# Patient Record
Sex: Male | Born: 1970 | Race: Black or African American | Hispanic: No | Marital: Single | Smoking: Current every day smoker
Health system: Southern US, Community
[De-identification: ages and names within clinical notes are randomized; demographics above are authoritative.]

## PROBLEM LIST (undated history)

## (undated) DIAGNOSIS — R011 Cardiac murmur, unspecified: Secondary | ICD-10-CM

## (undated) HISTORY — PX: KNEE SURGERY: SHX244

---

## 2008-03-09 HISTORY — PX: KNEE SURGERY: SHX244

## 2021-03-22 ENCOUNTER — Encounter (HOSPITAL_BASED_OUTPATIENT_CLINIC_OR_DEPARTMENT_OTHER): Payer: Self-pay | Admitting: Emergency Medicine

## 2021-03-22 ENCOUNTER — Other Ambulatory Visit: Payer: Self-pay

## 2021-03-22 ENCOUNTER — Emergency Department (HOSPITAL_BASED_OUTPATIENT_CLINIC_OR_DEPARTMENT_OTHER): Payer: 59

## 2021-03-22 ENCOUNTER — Emergency Department (HOSPITAL_BASED_OUTPATIENT_CLINIC_OR_DEPARTMENT_OTHER)
Admission: EM | Admit: 2021-03-22 | Discharge: 2021-03-22 | Disposition: A | Discharge status: Home / Self Care | Payer: 59 | Attending: Emergency Medicine | Admitting: Emergency Medicine

## 2021-03-22 DIAGNOSIS — R0602 Shortness of breath: Secondary | ICD-10-CM | POA: Insufficient documentation

## 2021-03-22 DIAGNOSIS — R062 Wheezing: Secondary | ICD-10-CM | POA: Insufficient documentation

## 2021-03-22 DIAGNOSIS — R079 Chest pain, unspecified: Secondary | ICD-10-CM

## 2021-03-22 LAB — CBC WITH DIFFERENTIAL/PLATELET
Abs Immature Granulocytes: 0.04 10*3/uL (ref 0.00–0.07)
Basophils Absolute: 0 10*3/uL (ref 0.0–0.1)
Basophils Relative: 1 %
Eosinophils Absolute: 0.2 10*3/uL (ref 0.0–0.5)
Eosinophils Relative: 2 %
HCT: 40.6 % (ref 39.0–52.0)
Hemoglobin: 14.1 g/dL (ref 13.0–17.0)
Immature Granulocytes: 1 %
Lymphocytes Relative: 43 %
Lymphs Abs: 3 10*3/uL (ref 0.7–4.0)
MCH: 32.5 pg (ref 26.0–34.0)
MCHC: 34.7 g/dL (ref 30.0–36.0)
MCV: 93.5 fL (ref 80.0–100.0)
Monocytes Absolute: 0.7 10*3/uL (ref 0.1–1.0)
Monocytes Relative: 10 %
Neutro Abs: 3.1 10*3/uL (ref 1.7–7.7)
Neutrophils Relative %: 43 %
Platelets: 240 10*3/uL (ref 150–400)
RBC: 4.34 MIL/uL (ref 4.22–5.81)
RDW: 13.2 % (ref 11.5–15.5)
WBC: 7 10*3/uL (ref 4.0–10.5)
nRBC: 0 % (ref 0.0–0.2)

## 2021-03-22 LAB — BASIC METABOLIC PANEL
Anion gap: 8 (ref 5–15)
BUN: 16 mg/dL (ref 6–20)
CO2: 25 mmol/L (ref 22–32)
Calcium: 9.4 mg/dL (ref 8.9–10.3)
Chloride: 103 mmol/L (ref 98–111)
Creatinine, Ser: 1.05 mg/dL (ref 0.61–1.24)
GFR, Estimated: >60 mL/min (ref >60)
Glucose, Bld: 96 mg/dL (ref 70–99)
Potassium: 3.9 mmol/L (ref 3.5–5.1)
Sodium: 136 mmol/L (ref 135–145)

## 2021-03-22 LAB — TROPONIN I (HIGH SENSITIVITY): Troponin I (High Sensitivity): 3 ng/L (ref <18)

## 2021-03-22 MED ORDER — OMEPRAZOLE 20 MG PO CPDR: 20.0000 mg | DELAYED_RELEASE_CAPSULE | Freq: Every day | ORAL | 0 refills | Status: DC

## 2021-03-22 MED ORDER — ALUM & MAG HYDROXIDE-SIMETH 200-200-20 MG/5ML PO SUSP
30.0000 mL | Freq: Once | ORAL | Status: AC
Start: 2021-03-22 — End: 2021-03-22
  Administered 2021-03-22: 30 mL via ORAL
  Filled 2021-03-22: qty 30

## 2021-03-22 MED ORDER — ALBUTEROL SULFATE HFA 108 (90 BASE) MCG/ACT IN AERS
2.0000 | INHALATION_SPRAY | Freq: Once | RESPIRATORY_TRACT | Status: AC
Start: 2021-03-22 — End: 2021-03-22
  Administered 2021-03-22: 2 via RESPIRATORY_TRACT
  Filled 2021-03-22: qty 6.7

## 2021-03-22 MED ORDER — LIDOCAINE VISCOUS HCL 2 % MT SOLN
15.0000 mL | Freq: Once | OROMUCOSAL | Status: AC
  Administered 2021-03-22: 15 mL via ORAL
  Filled 2021-03-22: qty 15

## 2021-03-22 NOTE — ED Notes (Signed)
Patient transported to X-ray 

## 2021-03-22 NOTE — ED Notes (Signed)
Pt ambulatory with steady gait to restroom 

## 2021-03-22 NOTE — ED Notes (Signed)
Pt discharged to home. Discharge instructions have been discussed with patient and/or family members. Pt verbally acknowledges understanding d/c instructions, and endorses comprehension to checkout at registration before leaving.  °

## 2021-03-22 NOTE — Discharge Instructions (Addendum)
Take the medication as discussed.  Follow-up with a primary care doctor if your symptoms continue.  Return to the emergency room if you have any worsening symptoms.

## 2021-03-22 NOTE — ED Triage Notes (Signed)
Pt arrives pov with c/o CP with shob x 2 weeks, denies n/v/d or radiation of pain. Ambulatory

## 2021-03-22 NOTE — ED Provider Notes (Signed)
[facility] Provider Note   CSN: [id number] Arrival date & time: 03/22/21  [id number]     History  Chief Complaint  Patient presents with   Chest Pain    [facility] is a 51 y.o. male.  Patient is a 51 year old male who presents with chest pain.  He reports a 2-week history of chest pain.  Its fairly constant but waxes and wanes in intensity.  Its in the center of his chest ranging from his epigastrium up into his throat area.  It is otherwise nonradiating.  He has some associated shortness of breath although he says he gets short of breath more when the pain intensifies.  He does not have shortness of breath on exertion.  No exertional chest pain.  No pleuritic pain.  He has noticed a bit of a cough and wheezing recently over the last month.  He is a smoker.  No known history of asthma or other lung issues.  No fevers.  He had a cold about a week ago but no recent symptoms.  No nausea or vomiting.  No diaphoresis.  No leg pain or swelling.  No known history of heart problems.      Home Medications Prior to Admission medications   Medication Sig Start Date End Date Taking? Authorizing Provider  omeprazole (PRILOSEC) 20 MG capsule Take 1 capsule (20 mg total) by mouth daily. 03/22/21   Malvin Johns, MD      Allergies    Patient has no allergy information on record.    Review of Systems   Review of Systems  Constitutional:  Negative for chills, diaphoresis, fatigue and fever.  HENT:  Negative for congestion, rhinorrhea and sneezing.   Eyes: Negative.   Respiratory:  Positive for cough, shortness of breath and wheezing. Negative for chest tightness.   Cardiovascular:  Positive for chest pain. Negative for leg swelling.  Gastrointestinal:  Negative for abdominal pain, blood in stool, diarrhea, nausea and vomiting.  Genitourinary:  Negative for difficulty urinating, flank pain, frequency and hematuria.  Musculoskeletal:  Negative for arthralgias and  back pain.  Skin:  Negative for rash.  Neurological:  Negative for dizziness, speech difficulty, weakness, numbness and headaches.   Physical Exam Updated Vital Signs BP 118/79 (BP Location: Right Arm)    Pulse 87    Temp 98.5 F (36.9 C) (Oral)    Resp 15    Ht 6' (1.829 m)    Wt 83.9 kg    SpO2 99%    BMI 25.09 kg/m  Physical Exam Constitutional:      Appearance: He is well-developed.  HENT:     Head: Normocephalic and atraumatic.  Eyes:     Pupils: Pupils are equal, round, and reactive to light.  Cardiovascular:     Rate and Rhythm: Normal rate and regular rhythm.     Heart sounds: Normal heart sounds.  Pulmonary:     Effort: Pulmonary effort is normal. No respiratory distress.     Breath sounds: Wheezing (Few scattered wheezes on exam, no increased work of breathing, no tachypnea) present. No rales.  Chest:     Chest wall: No tenderness.  Abdominal:     General: Bowel sounds are normal.     Palpations: Abdomen is soft.     Tenderness: There is no abdominal tenderness. There is no guarding or rebound.  Musculoskeletal:        General: Normal range of motion.     Cervical back: Normal range of  motion and neck supple.     Comments: No edema or calf tenderness  Lymphadenopathy:     Cervical: No cervical adenopathy.  Skin:    General: Skin is warm and dry.     Findings: No rash.  Neurological:     Mental Status: He is alert and oriented to person, place, and time.    ED Results / Procedures / Treatments   Labs (all labs ordered are listed, but only abnormal results are displayed) Labs Reviewed  BASIC METABOLIC PANEL  CBC WITH DIFFERENTIAL/PLATELET  TROPONIN I (HIGH SENSITIVITY)  TROPONIN I (HIGH SENSITIVITY)    EKG EKG Interpretation  Date/Time:  Saturday March 22 2021 08:11:44 EST Ventricular Rate:  94 PR Interval:  177 QRS Duration: 96 QT Interval:  334 QTC Calculation: 418 R Axis:   65 Text Interpretation: Sinus rhythm ST elev, probable normal early  repol pattern No old tracing to compare Confirmed by Malvin Johns [phone]) on 03/22/2021 8:12:54 AM  Radiology DG Chest 2 View  Result Date: 03/22/2021 CLINICAL DATA:  Chest pain EXAM: CHEST - 2 VIEW COMPARISON:  None. FINDINGS: The heart size and mediastinal contours are within normal limits. Both lungs are clear. The visualized skeletal structures are unremarkable. IMPRESSION: No active cardiopulmonary disease. Electronically Signed   By: Elmer Picker M.D.   On: 03/22/2021 08:38    Procedures Procedures    Medications Ordered in ED Medications  alum & mag hydroxide-simeth (MAALOX/MYLANTA) 200-200-20 MG/5ML suspension 30 mL (30 mLs Oral Given 03/22/21 0859)    And  lidocaine (XYLOCAINE) 2 % viscous mouth solution 15 mL (15 mLs Oral Given 03/22/21 0859)  albuterol (VENTOLIN HFA) 108 (90 Base) MCG/ACT inhaler 2 puff (2 puffs Inhalation Given 03/22/21 0831)    ED Course/ Medical Decision Making/ A&P                           Medical Decision Making Amount and/or Complexity of Data Reviewed Labs: ordered. Decision-making details documented in ED Course. Radiology: ordered and independent interpretation performed. Decision-making details documented in ED Course. ECG/medicine tests: ordered and independent interpretation performed. Decision-making details documented in ED Course.  Risk Prescription drug management. Decision regarding hospitalization.   Patient is a 51 year old male who presents with chest pain.  Its in the center of his chest.  He does have a known history of GERD which he says is pretty bad.  He has no reproducible tenderness.  He has had a troponin that was negative.  No ischemic changes on EKG.  This was reviewed by me.  He had a chest x-ray which shows no acute abnormalities.  This was interpreted by me as well.  His other labs are nonconcerning.  He does not have symptoms that sound more concerning for aortic dissection, PE or ACS.  His symptoms improved after GI  cocktail in the ED.  I feel that it likely could be related to GERD.  A second troponin was not ordered given the constant nature of his symptoms.  I did not feel a second troponin would be beneficial at this point.  His symptoms have resolved after the GI cocktail.  He did have a little bit of wheezing on exam but no ongoing or current shortness of breath.  This is likely related to his smoking.  He was counseled on smoking cessation.  He was given an albuterol inhaler to use.  He was encouraged to follow-up with her primary care doctor.  Return  precautions were given.  He was given a prescription for omeprazole.    Admission for further cardiac evaluation was considered but he has a low heart score and no ongoing symptoms.  Outpatient follow-up appears appropriate.  Final Clinical Impression(s) / ED Diagnoses Final diagnoses:  Nonspecific chest pain    Rx / DC Orders ED Discharge Orders          Ordered    omeprazole (PRILOSEC) 20 MG capsule  Daily,   Status:  Discontinued        03/22/21 0933    omeprazole (PRILOSEC) 20 MG capsule  Daily        03/22/21 0934              Malvin Johns, MD 03/22/21 [phone]

## 2021-03-22 NOTE — ED Notes (Signed)
Pt endorses hx of reflux

## 2021-03-22 NOTE — ED Notes (Signed)
ED Provider at bedside. 

## 2022-01-17 ENCOUNTER — Other Ambulatory Visit: Payer: Self-pay

## 2022-01-17 ENCOUNTER — Emergency Department (HOSPITAL_BASED_OUTPATIENT_CLINIC_OR_DEPARTMENT_OTHER): Payer: 59 | Admitting: Radiology

## 2022-01-17 ENCOUNTER — Encounter (HOSPITAL_BASED_OUTPATIENT_CLINIC_OR_DEPARTMENT_OTHER): Payer: Self-pay | Admitting: Emergency Medicine

## 2022-01-17 ENCOUNTER — Emergency Department (HOSPITAL_BASED_OUTPATIENT_CLINIC_OR_DEPARTMENT_OTHER)
Admission: EM | Admit: 2022-01-17 | Discharge: 2022-01-17 | Disposition: A | Discharge status: Home / Self Care | Payer: 59 | Attending: Emergency Medicine | Admitting: Emergency Medicine

## 2022-01-17 DIAGNOSIS — Z87891 Personal history of nicotine dependence: Secondary | ICD-10-CM | POA: Insufficient documentation

## 2022-01-17 DIAGNOSIS — R079 Chest pain, unspecified: Secondary | ICD-10-CM | POA: Diagnosis present

## 2022-01-17 DIAGNOSIS — R072 Precordial pain: Secondary | ICD-10-CM | POA: Insufficient documentation

## 2022-01-17 LAB — CBC
HCT: 40.5 % (ref 39.0–52.0)
Hemoglobin: 13.5 g/dL (ref 13.0–17.0)
MCH: 32.1 pg (ref 26.0–34.0)
MCHC: 33.3 g/dL (ref 30.0–36.0)
MCV: 96.4 fL (ref 80.0–100.0)
Platelets: 208 10*3/uL (ref 150–400)
RBC: 4.2 MIL/uL — ABNORMAL LOW (ref 4.22–5.81)
RDW: 14.3 % (ref 11.5–15.5)
WBC: 8 10*3/uL (ref 4.0–10.5)
nRBC: 0 % (ref 0.0–0.2)

## 2022-01-17 LAB — BASIC METABOLIC PANEL
Anion gap: 10 (ref 5–15)
BUN: 8 mg/dL (ref 6–20)
CO2: 21 mmol/L — ABNORMAL LOW (ref 22–32)
Calcium: 8.6 mg/dL — ABNORMAL LOW (ref 8.9–10.3)
Chloride: 105 mmol/L (ref 98–111)
Creatinine, Ser: 1.03 mg/dL (ref 0.61–1.24)
GFR, Estimated: >60 mL/min (ref >60)
Glucose, Bld: 114 mg/dL — ABNORMAL HIGH (ref 70–99)
Potassium: 3.5 mmol/L (ref 3.5–5.1)
Sodium: 136 mmol/L (ref 135–145)

## 2022-01-17 LAB — TROPONIN I (HIGH SENSITIVITY)
Troponin I (High Sensitivity): 3 ng/L (ref <18)
Troponin I (High Sensitivity): 2 ng/L (ref <18)

## 2022-01-17 NOTE — ED Triage Notes (Signed)
Chest pain comes and goes since Tuesday Slightly more intense tonight. Some sob, headaches, periods of lightheaded

## 2022-01-17 NOTE — Discharge Instructions (Signed)
You were seen today for chest pain.  Your work-up today is reassuring.  You should consider quitting smoking as this puts you at increased risk for cardiac disease.  Follow-up with cardiology.

## 2022-01-17 NOTE — ED Notes (Signed)
Pt. Resting with eyes closed, resps even and unlabored

## 2022-01-17 NOTE — ED Provider Notes (Signed)
[facility] EMERGENCY DEPT Provider Note   CSN: [id number] Arrival date & time: 01/17/22  0215     History  Chief Complaint  Patient presents with   Chest Pain    Arney Mayabb is a 51 y.o. male.  HPI    \ This is a 51 year old male with a history of smoking who presents with chest pain.  Patient reports intermittent chest pain that comes and goes.  It has been ongoing since Tuesday.  He describes it as sharp and nonradiating.  It is not associated with eating.  He does report at times he feels like he needs to sit down to catch his breath.  No recent fevers or cough.  Denies any recent travel or history of blood clots.  Home Medications Prior to Admission medications   Medication Sig Start Date End Date Taking? Authorizing Provider  omeprazole (PRILOSEC) 20 MG capsule Take 1 capsule (20 mg total) by mouth daily. 03/22/21   Rolan Bucco, MD      Allergies    Patient has no known allergies.    Review of Systems   Review of Systems  Respiratory:  Positive for shortness of breath.   Cardiovascular:  Positive for chest pain.  All other systems reviewed and are negative.   Physical Exam Updated Vital Signs BP 136/75   Pulse 69   Temp 97.8 F (36.6 C) (Oral)   Resp 15   Ht 1.803 m (5\' 11" )   Wt 86.2 kg   SpO2 96%   BMI 26.50 kg/m  Physical Exam Vitals and nursing note reviewed.  Constitutional:      Appearance: He is well-developed.  HENT:     Head: Normocephalic and atraumatic.  Eyes:     Pupils: Pupils are equal, round, and reactive to light.  Cardiovascular:     Rate and Rhythm: Normal rate and regular rhythm.     Heart sounds: Normal heart sounds. No murmur heard. Pulmonary:     Effort: Pulmonary effort is normal. No respiratory distress.     Breath sounds: Normal breath sounds. No wheezing.  Abdominal:     General: Bowel sounds are normal.     Palpations: Abdomen is soft.     Tenderness: There is no abdominal tenderness. There is no  rebound.  Musculoskeletal:     Cervical back: Neck supple.  Lymphadenopathy:     Cervical: No cervical adenopathy.  Skin:    General: Skin is warm and dry.  Neurological:     General: No focal deficit present.     Mental Status: He is alert and oriented to person, place, and time.     ED Results / Procedures / Treatments   Labs (all labs ordered are listed, but only abnormal results are displayed) Labs Reviewed  CBC - Abnormal; Notable for the following components:      Result Value   RBC 4.20 (*)    All other components within normal limits  BASIC METABOLIC PANEL - Abnormal; Notable for the following components:   CO2 21 (*)    Glucose, Bld 114 (*)    Calcium 8.6 (*)    All other components within normal limits  TROPONIN I (HIGH SENSITIVITY)  TROPONIN I (HIGH SENSITIVITY)    EKG None  Radiology DG Chest 2 View  Result Date: 01/17/2022 CLINICAL DATA:  Chest pain. EXAM: CHEST - 2 VIEW COMPARISON:  03/22/2021. FINDINGS: The heart size and mediastinal contours are within normal limits. No consolidation, effusion, or pneumothorax. No acute  osseous abnormality. IMPRESSION: No active cardiopulmonary disease. Electronically Signed   By: Thornell Sartorius M.D.   On: 01/17/2022 02:52    Procedures Procedures    Medications Ordered in ED Medications - No data to display  ED Course/ Medical Decision Making/ A&P                           Medical Decision Making Amount and/or Complexity of Data Reviewed Labs: ordered. Radiology: ordered.   This patient presents to the ED for concern of chest pain, this involves an extensive number of treatment options, and is a complaint that carries with it a high risk of complications and morbidity.  I considered the following differential and admission for this acute, potentially life threatening condition.  The differential diagnosis includes ACS, pneumonia, pneumothorax, PE, costochondritis  MDM:    This is a 51 year old male who  presents with chest discomfort.  He is nontoxic and vital signs are reassuring.  His only risk factor for ACS is smoking.  EKG is unchanged from prior without evidence of acute arrhythmia or ischemia.  Troponin x2 is negative.  CBC and BMP are largely reassuring.  Chest x-ray without pneumothorax or pneumonia.  He is PERC negative.  Given his smoking history and age as well as some exertional symptoms, his heart score puts him at 2.  We will have him follow-up with cardiology  (Labs, imaging, consults)  Labs: I Ordered, and personally interpreted labs.  The pertinent results include: BBC, BMP, troponin x2  Imaging Studies ordered: I ordered imaging studies including x-ray I independently visualized and interpreted imaging. I agree with the radiologist interpretation  Additional history obtained from chart review.  External records from outside source obtained and reviewed including prior evaluations  Cardiac Monitoring: The patient was maintained on a cardiac monitor.  I personally viewed and interpreted the cardiac monitored which showed an underlying rhythm of: Sinus rhythm  Reevaluation: After the interventions noted above, I reevaluated the patient and found that they have :improved  Social Determinants of Health:  smoker  Disposition: Discharge  Co morbidities that complicate the patient evaluation History reviewed. No pertinent past medical history.   Medicines No orders of the defined types were placed in this encounter.   I have reviewed the patients home medicines and have made adjustments as needed  Problem List / ED Course: Problem List Items Addressed This Visit   None Visit Diagnoses     Precordial pain    -  Primary   Relevant Orders   Ambulatory referral to Cardiology                   Final Clinical Impression(s) / ED Diagnoses Final diagnoses:  Precordial pain    Rx / DC Orders ED Discharge Orders          Ordered    Ambulatory  referral to Cardiology        01/17/22 0629              Shon Baton, MD 01/17/22 [phone]

## 2022-01-30 ENCOUNTER — Emergency Department (HOSPITAL_BASED_OUTPATIENT_CLINIC_OR_DEPARTMENT_OTHER)
Admission: EM | Admit: 2022-01-30 | Discharge: 2022-01-30 | Disposition: A | Discharge status: Home / Self Care | Payer: 59 | Attending: Emergency Medicine | Admitting: Emergency Medicine

## 2022-01-30 ENCOUNTER — Encounter (HOSPITAL_BASED_OUTPATIENT_CLINIC_OR_DEPARTMENT_OTHER): Payer: Self-pay

## 2022-01-30 ENCOUNTER — Other Ambulatory Visit: Payer: Self-pay

## 2022-01-30 DIAGNOSIS — M545 Low back pain, unspecified: Secondary | ICD-10-CM | POA: Diagnosis present

## 2022-01-30 MED ORDER — NAPROXEN 500 MG PO TABS: 500.0000 mg | ORAL_TABLET | Freq: Two times a day (BID) | ORAL | 0 refills | Status: DC

## 2022-01-30 MED ORDER — CYCLOBENZAPRINE HCL 10 MG PO TABS: 10.0000 mg | ORAL_TABLET | Freq: Three times a day (TID) | ORAL | 0 refills | Status: DC | PRN

## 2022-01-30 MED ORDER — KETOROLAC TROMETHAMINE 60 MG/2ML IM SOLN
60.0000 mg | Freq: Once | INTRAMUSCULAR | Status: AC
  Administered 2022-01-30: 60 mg via INTRAMUSCULAR
  Filled 2022-01-30: qty 2

## 2022-01-30 NOTE — ED Notes (Signed)
ED Provider at bedside. 

## 2022-01-30 NOTE — Discharge Instructions (Signed)
Begin taking naproxen as prescribed.  Begin taking Flexeril as prescribed as needed for pain not relieved with naproxen.  Rest.  Follow-up with primary doctor if not improving in the next week.

## 2022-01-30 NOTE — ED Provider Notes (Signed)
  [facility] EMERGENCY DEPT Provider Note   CSN: [id number] Arrival date & time: 01/30/22  0016     History  Chief Complaint  Patient presents with   Back Pain    [facility] is a 51 y.o. male.  Patient is a 52 year old male with no significant past medical history.  He presents with a 2-week history of low back pain.  This began in the absence of any injury or trauma.  He describes a constant ache to the center of his low back with no radiation into his legs.  He denies any bowel or bladder complaints.  He denies any weakness or numbness of the legs.  Pain is worse with bending and movement with no alleviating factors.  The history is provided by the patient.       Home Medications Prior to Admission medications   Medication Sig Start Date End Date Taking? Authorizing Provider  omeprazole (PRILOSEC) 20 MG capsule Take 1 capsule (20 mg total) by mouth daily. 03/22/21   Rolan Bucco, MD      Allergies    Patient has no known allergies.    Review of Systems   Review of Systems  All other systems reviewed and are negative.   Physical Exam Updated Vital Signs BP 121/84 (BP Location: Left Arm)   Pulse 84   Temp 98.7 F (37.1 C) (Oral)   Resp 18   Ht 6' (1.829 m)   Wt 86.6 kg   SpO2 99%   BMI 25.90 kg/m  Physical Exam Vitals and nursing note reviewed.  Constitutional:      General: He is not in acute distress.    Appearance: Normal appearance. He is not ill-appearing.  HENT:     Head: Normocephalic and atraumatic.  Pulmonary:     Effort: Pulmonary effort is normal.  Musculoskeletal:     Comments: There is tenderness to palpation in the soft tissues of the lumbar region.  Skin:    General: Skin is warm and dry.  Neurological:     General: No focal deficit present.     Mental Status: He is alert and oriented to person, place, and time.     Comments: DTRs are 1+ and symmetrical in the patellar and Achilles tendons bilaterally.  Strength is 5  out of 5 in both lower extremities.  He is able to ambulate on heels and toes.     ED Results / Procedures / Treatments   Labs (all labs ordered are listed, but only abnormal results are displayed) Labs Reviewed - No data to display  EKG None  Radiology No results found.  Procedures Procedures    Medications Ordered in ED Medications  ketorolac (TORADOL) injection 60 mg (has no administration in time range)    ED Course/ Medical Decision Making/ A&P  Patient presenting here with low back pain as described in the HPI.  I highly suspect a muscular etiology.  He has no radicular symptoms, normal and symmetrical reflexes, and no red flags that would suggest an emergent situation.  He will be treated with IM Toradol and discharged with naproxen and NSAIDs.  To return as needed for any problems, follow-up if not improving.  Final Clinical Impression(s) / ED Diagnoses Final diagnoses:  None    Rx / DC Orders ED Discharge Orders     None         Geoffery Lyons, MD 01/30/22 [phone]

## 2022-01-30 NOTE — ED Triage Notes (Signed)
Pt presents with c/o lower back pain for two weeks. Unknown injury to back. Denies difficulty voiding or passing stool.   Denies radiation down either leg.

## 2022-04-02 ENCOUNTER — Other Ambulatory Visit: Payer: Self-pay

## 2022-04-02 ENCOUNTER — Encounter (HOSPITAL_BASED_OUTPATIENT_CLINIC_OR_DEPARTMENT_OTHER): Payer: Self-pay | Admitting: *Deleted

## 2022-04-02 ENCOUNTER — Emergency Department (HOSPITAL_BASED_OUTPATIENT_CLINIC_OR_DEPARTMENT_OTHER)
Admission: EM | Admit: 2022-04-02 | Discharge: 2022-04-02 | Disposition: A | Discharge status: Home / Self Care | Payer: 59 | Attending: Emergency Medicine | Admitting: Emergency Medicine

## 2022-04-02 DIAGNOSIS — J029 Acute pharyngitis, unspecified: Secondary | ICD-10-CM | POA: Insufficient documentation

## 2022-04-02 DIAGNOSIS — R0981 Nasal congestion: Secondary | ICD-10-CM | POA: Insufficient documentation

## 2022-04-02 DIAGNOSIS — R519 Headache, unspecified: Secondary | ICD-10-CM | POA: Insufficient documentation

## 2022-04-02 DIAGNOSIS — J069 Acute upper respiratory infection, unspecified: Secondary | ICD-10-CM

## 2022-04-02 NOTE — ED Notes (Signed)
Discharge paperwork given and verbally understood. 

## 2022-04-02 NOTE — ED Provider Notes (Signed)
[facility] Provider Note   CSN: [id number] Arrival date & time: 04/02/22  0630     History  Chief Complaint  Patient presents with   Headache    [facility] is a 52 y.o. male.  [facility] is a 52 y.o. male who is otherwise healthy, presents to the ED requesting a note to return to work.  He reports that he missed work on Monday due to headache, nasal congestion, runny nose and chills.  He reports that overall symptoms are improving he still seems to have a very mild frontal headache but no continued fevers, no cough.  He reports multiple coworkers have been out recently with flu and other illnesses.  He has not taken any thing for his symptoms at this time.  He denies any nausea, vomiting, diarrhea, chest pain, shortness of breath or abdominal pain.  Reports he cannot return to work without a note.  The history is provided by the patient.  Headache Associated symptoms: congestion and sore throat   Associated symptoms: no abdominal pain, no cough, no diarrhea, no fever, no nausea, no neck pain, no neck stiffness and no vomiting        Home Medications Prior to Admission medications   Medication Sig Start Date End Date Taking? Authorizing Provider  cyclobenzaprine (FLEXERIL) 10 MG tablet Take 1 tablet (10 mg total) by mouth 3 (three) times daily as needed for muscle spasms. 01/30/22   Veryl Speak, MD  naproxen (NAPROSYN) 500 MG tablet Take 1 tablet (500 mg total) by mouth 2 (two) times daily. 01/30/22   Veryl Speak, MD  omeprazole (PRILOSEC) 20 MG capsule Take 1 capsule (20 mg total) by mouth daily. 03/22/21   Malvin Johns, MD      Allergies    Patient has no known allergies.    Review of Systems   Review of Systems  Constitutional:  Positive for chills. Negative for fever.  HENT:  Positive for congestion, rhinorrhea and sore throat.   Respiratory:  Negative for cough and shortness of breath.   Cardiovascular:  Negative  for chest pain.  Gastrointestinal:  Negative for abdominal pain, diarrhea, nausea and vomiting.  Musculoskeletal:  Negative for neck pain and neck stiffness.  Neurological:  Positive for headaches.    Physical Exam Updated Vital Signs BP (!) 132/90 (BP Location: Left Arm)   Pulse 93   Temp 98.2 F (36.8 C) (Oral)   Resp 18   Ht 6' (1.829 m)   Wt 83.9 kg   SpO2 98%   BMI 25.09 kg/m  Physical Exam Vitals and nursing note reviewed.  Constitutional:      General: He is not in acute distress.    Appearance: He is well-developed. He is not ill-appearing or diaphoretic.  HENT:     Head: Normocephalic and atraumatic.     Nose: Congestion and rhinorrhea present.     Comments: Mild tenderness noted over the left frontal sinus with some nasal congestion noted worse on the right    Mouth/Throat:     Mouth: Mucous membranes are moist.     Pharynx: Oropharynx is clear. No oropharyngeal exudate or posterior oropharyngeal erythema.  Eyes:     General:        Right eye: No discharge.        Left eye: No discharge.  Neck:     Comments: No rigidity, full active ROM, no meningeal signs Cardiovascular:     Rate and Rhythm: Normal rate  and regular rhythm.     Heart sounds: Normal heart sounds. No murmur heard.    No friction rub. No gallop.  Pulmonary:     Effort: Pulmonary effort is normal. No respiratory distress.     Breath sounds: Normal breath sounds.     Comments: Respirations equal and unlabored, patient able to speak in full sentences, lungs clear to auscultation bilaterally  Abdominal:     General: Bowel sounds are normal. There is no distension.     Palpations: Abdomen is soft. There is no mass.     Tenderness: There is no abdominal tenderness. There is no guarding.     Comments: Abdomen soft, nondistended, nontender to palpation in all quadrants without guarding or peritoneal signs  Musculoskeletal:        General: No deformity.     Cervical back: Neck supple.   Lymphadenopathy:     Cervical: No cervical adenopathy.  Skin:    General: Skin is warm and dry.     Capillary Refill: Capillary refill takes less than 2 seconds.  Neurological:     General: No focal deficit present.     Mental Status: He is alert and oriented to person, place, and time.     GCS: GCS eye subscore is 4. GCS verbal subscore is 5. GCS motor subscore is 6.     Comments: Speech is clear, able to follow commands Moves extremities without ataxia, coordination intact  Psychiatric:        Mood and Affect: Mood normal.        Behavior: Behavior normal.     ED Results / Procedures / Treatments   Labs (all labs ordered are listed, but only abnormal results are displayed) Labs Reviewed - No data to display  EKG None  Radiology No results found.  Procedures Procedures    Medications Ordered in ED Medications - No data to display  ED Course/ Medical Decision Making/ A&P                             Medical Decision Making  52 yo male presents requesting note to return to work after URI symptoms starting on Monday that seem to be improving.  Reports mild headache at this time, has not used any medications to support symptoms.  Patient does still have some mild nasal congestion and tenderness over the sinus but given improvement in symptoms do not feel that further testing is indicated at this time.  Suspect viral upper respiratory illness that is resolving.  Discussed continued supportive care with over-the-counter medications.  No provided to return to work.        Final Clinical Impression(s) / ED Diagnoses Final diagnoses:  None    Rx / DC Orders ED Discharge Orders     None         Janet Berlin 04/02/22 [place], Ankit, MD 04/03/22 1607

## 2022-04-02 NOTE — ED Triage Notes (Addendum)
Here from home for "work note" to return to work. Reports missed work on Monday d/t HA, nasal congestion, runny nose and chills. Sx resolving. Endorses "only mild HA at this time". Alert, NAD, calm, interactive, steady gait. Denies NVD, fever, syncope, or sob. States again, "just need a return to work note".

## 2022-04-02 NOTE — Discharge Instructions (Signed)
Your symptoms are likely caused by a viral upper respiratory infection. Antibiotics are not helpful in treating viral infection, the virus should run its course in about 5-7 days. Please make sure you are drinking plenty of fluids. You can treat your symptoms supportively with tylenol/ibuprofen for fevers and pains, Zyrtec and Flonase to help with nasal congestion, and over the counter cough syrups and throat lozenges to help with cough. If your symptoms are not improving please follow up with you Primary doctor.   If you develop persistent fevers, shortness of breath or difficulty breathing, chest pain, severe headache and neck pain, persistent nausea and vomiting or other new or concerning symptoms return to the Emergency department.  

## 2022-05-21 ENCOUNTER — Ambulatory Visit (HOSPITAL_BASED_OUTPATIENT_CLINIC_OR_DEPARTMENT_OTHER): Payer: 59 | Admitting: Family Medicine

## 2022-06-01 ENCOUNTER — Encounter (HOSPITAL_BASED_OUTPATIENT_CLINIC_OR_DEPARTMENT_OTHER): Payer: Self-pay | Admitting: Emergency Medicine

## 2022-06-01 ENCOUNTER — Emergency Department (HOSPITAL_BASED_OUTPATIENT_CLINIC_OR_DEPARTMENT_OTHER): Payer: 59

## 2022-06-01 ENCOUNTER — Emergency Department (HOSPITAL_BASED_OUTPATIENT_CLINIC_OR_DEPARTMENT_OTHER)
Admission: EM | Admit: 2022-06-01 | Discharge: 2022-06-01 | Disposition: A | Discharge status: Home / Self Care | Payer: 59 | Attending: Emergency Medicine | Admitting: Emergency Medicine

## 2022-06-01 ENCOUNTER — Other Ambulatory Visit: Payer: Self-pay

## 2022-06-01 DIAGNOSIS — R519 Headache, unspecified: Secondary | ICD-10-CM | POA: Insufficient documentation

## 2022-06-01 DIAGNOSIS — R110 Nausea: Secondary | ICD-10-CM | POA: Diagnosis not present

## 2022-06-01 LAB — CBC WITH DIFFERENTIAL/PLATELET
Abs Immature Granulocytes: 0.01 10*3/uL (ref 0.00–0.07)
Basophils Absolute: 0 10*3/uL (ref 0.0–0.1)
Basophils Relative: 0 %
Eosinophils Absolute: 0.1 10*3/uL (ref 0.0–0.5)
Eosinophils Relative: 1 %
HCT: 41.7 % (ref 39.0–52.0)
Hemoglobin: 14.4 g/dL (ref 13.0–17.0)
Immature Granulocytes: 0 %
Lymphocytes Relative: 17 %
Lymphs Abs: 1.3 10*3/uL (ref 0.7–4.0)
MCH: 32.2 pg (ref 26.0–34.0)
MCHC: 34.5 g/dL (ref 30.0–36.0)
MCV: 93.3 fL (ref 80.0–100.0)
Monocytes Absolute: 0.6 10*3/uL (ref 0.1–1.0)
Monocytes Relative: 8 %
Neutro Abs: 5.9 10*3/uL (ref 1.7–7.7)
Neutrophils Relative %: 74 %
Platelets: 222 10*3/uL (ref 150–400)
RBC: 4.47 MIL/uL (ref 4.22–5.81)
RDW: 13.8 % (ref 11.5–15.5)
WBC: 7.9 10*3/uL (ref 4.0–10.5)
nRBC: 0 % (ref 0.0–0.2)

## 2022-06-01 LAB — BASIC METABOLIC PANEL
Anion gap: 6 (ref 5–15)
BUN: 13 mg/dL (ref 6–20)
CO2: 23 mmol/L (ref 22–32)
Calcium: 9.5 mg/dL (ref 8.9–10.3)
Chloride: 110 mmol/L (ref 98–111)
Creatinine, Ser: 0.91 mg/dL (ref 0.61–1.24)
GFR, Estimated: >60 mL/min (ref >60)
Glucose, Bld: 91 mg/dL (ref 70–99)
Potassium: 3.6 mmol/L (ref 3.5–5.1)
Sodium: 139 mmol/L (ref 135–145)

## 2022-06-01 MED ORDER — ONDANSETRON 4 MG PO TBDP: 4.0000 mg | ORAL_TABLET | Freq: Three times a day (TID) | ORAL | 0 refills | Status: DC | PRN

## 2022-06-01 MED ORDER — SODIUM CHLORIDE 0.9 % IV SOLN
Freq: Once | INTRAVENOUS | Status: AC
Start: 2022-06-01 — End: 2022-06-01

## 2022-06-01 MED ORDER — IOHEXOL 300 MG/ML  SOLN
100.0000 mL | Freq: Once | INTRAMUSCULAR | Status: AC | PRN
  Administered 2022-06-01: 75 mL via INTRAVENOUS

## 2022-06-01 MED ORDER — KETOROLAC TROMETHAMINE 30 MG/ML IJ SOLN
30.0000 mg | Freq: Once | INTRAMUSCULAR | Status: AC
  Administered 2022-06-01: 30 mg via INTRAVENOUS
  Filled 2022-06-01: qty 1

## 2022-06-01 MED ORDER — METOCLOPRAMIDE HCL 5 MG/ML IJ SOLN
10.0000 mg | Freq: Once | INTRAMUSCULAR | Status: AC
  Administered 2022-06-01: 10 mg via INTRAVENOUS
  Filled 2022-06-01: qty 2

## 2022-06-01 MED ORDER — ACETAMINOPHEN 500 MG PO TABS
1000.0000 mg | ORAL_TABLET | Freq: Once | ORAL | Status: AC
  Administered 2022-06-01: 1000 mg via ORAL
  Filled 2022-06-01: qty 2

## 2022-06-01 MED ORDER — DIPHENHYDRAMINE HCL 50 MG/ML IJ SOLN
25.0000 mg | Freq: Once | INTRAMUSCULAR | Status: AC
  Administered 2022-06-01: 25 mg via INTRAVENOUS
  Filled 2022-06-01: qty 1

## 2022-06-01 MED ORDER — IBUPROFEN 800 MG PO TABS: 800.0000 mg | ORAL_TABLET | Freq: Three times a day (TID) | ORAL | 0 refills | Status: DC

## 2022-06-01 NOTE — ED Provider Notes (Signed)
[facility] Provider Note   CSN: [id number] Arrival date & time: 06/01/22  1750     History  Chief Complaint  Patient presents with   Headache   Chest Pain    Luke Black is a 52 y.o. male.  HPI Patient reports he awakened at 5 or 6 this morning with a severe headache.  He reports the headache is generalized.  He did not noted to be 1 side or the other.  He denies any vomiting.  He reports that 1 point he did feel kind of nauseated but never went on to vomit.  He reports he been very light sensitive with his eyes.  No fevers.  He reports he was feeling well before symptoms started.  He denies any passing out episode.  Patient denies history of headaches.  He reports he is otherwise healthy.  He denies drugs of abuse.  Denies any recent dental pain sore throat or sinus congestion.  Patient reports a few days ago he had some pains that were on the back of his chest or behind the shoulder blades.  At one point it was somewhat on the left and then that moved to the right.  He reports those pains are gone and he did not have any chest pain or thoracic pain in association with his headache today.  He reports he ate some food last night and went to bed but this morning had a combination of feeling nauseated but did not vomit and a bad headache.  Denies any injury or other unusual activities leading up to this headache.    Home Medications Prior to Admission medications   Medication Sig Start Date End Date Taking? Authorizing Provider  ibuprofen (ADVIL) 800 MG tablet Take 1 tablet (800 mg total) by mouth 3 (three) times daily. 06/01/22  Yes Charlesetta Shanks, MD  ondansetron (ZOFRAN-ODT) 4 MG disintegrating tablet Take 1 tablet (4 mg total) by mouth every 8 (eight) hours as needed for nausea or vomiting. 06/01/22  Yes Charlesetta Shanks, MD  cyclobenzaprine (FLEXERIL) 10 MG tablet Take 1 tablet (10 mg total) by mouth 3 (three) times daily as needed for  muscle spasms. 01/30/22   Veryl Speak, MD  naproxen (NAPROSYN) 500 MG tablet Take 1 tablet (500 mg total) by mouth 2 (two) times daily. 01/30/22   Veryl Speak, MD  omeprazole (PRILOSEC) 20 MG capsule Take 1 capsule (20 mg total) by mouth daily. 03/22/21   Malvin Johns, MD      Allergies    Patient has no known allergies.    Review of Systems   Review of Systems  Physical Exam Updated Vital Signs BP 126/77   Pulse 82   Temp 98.1 F (36.7 C) (Oral)   Resp (!) 21   Ht 6' (1.829 m)   Wt 83 kg   SpO2 96%   BMI 24.82 kg/m  Physical Exam Constitutional:      Comments: Comfortable no confusion or somnolence.  HENT:     Head: Normocephalic and atraumatic.     Right Ear: Tympanic membrane normal.     Left Ear: Tympanic membrane normal.     Mouth/Throat:     Mouth: Mucous membranes are moist.     Pharynx: Oropharynx is clear.     Comments: Dentition good condition. Eyes:     Extraocular Movements: Extraocular movements intact.     Pupils: Pupils are equal, round, and reactive to light.  Cardiovascular:     Rate and  Rhythm: Normal rate and regular rhythm.  Pulmonary:     Effort: Pulmonary effort is normal.     Breath sounds: Normal breath sounds.  Abdominal:     General: There is no distension.     Palpations: Abdomen is soft.     Tenderness: There is no abdominal tenderness. There is no guarding.  Musculoskeletal:        General: Normal range of motion.     Cervical back: Neck supple.     Right lower leg: No edema.     Left lower leg: No edema.  Skin:    General: Skin is warm and dry.  Neurological:     General: No focal deficit present.     Mental Status: He is oriented to person, place, and time.     Cranial Nerves: No cranial nerve deficit.     Motor: No weakness.     Coordination: Coordination normal.     ED Results / Procedures / Treatments   Labs (all labs ordered are listed, but only abnormal results are displayed) Labs Reviewed  BASIC METABOLIC  PANEL  CBC WITH DIFFERENTIAL/PLATELET    EKG None  Radiology CT ANGIO HEAD NECK W WO CM  Result Date: 06/01/2022 CLINICAL DATA:  Sudden severe headache EXAM: CT ANGIOGRAPHY HEAD AND NECK TECHNIQUE: Multidetector CT imaging of the head and neck was performed using the standard protocol during bolus administration of intravenous contrast. Multiplanar CT image reconstructions and MIPs were obtained to evaluate the vascular anatomy. Carotid stenosis measurements (when applicable) are obtained utilizing NASCET criteria, using the distal internal carotid diameter as the denominator. RADIATION DOSE REDUCTION: This exam was performed according to the departmental dose-optimization program which includes automated exposure control, adjustment of the mA and/or kV according to patient size and/or use of iterative reconstruction technique. CONTRAST:  41mL OMNIPAQUE IOHEXOL 300 MG/ML  SOLN COMPARISON:  06/01/2022 head CT FINDINGS: CTA NECK FINDINGS SKELETON: There is no bony spinal canal stenosis. No lytic or blastic lesion. OTHER NECK: Normal pharynx, larynx and major salivary glands. No cervical lymphadenopathy. Unremarkable thyroid gland. UPPER CHEST: No pneumothorax or pleural effusion. No nodules or masses. AORTIC ARCH: There is no calcific atherosclerosis of the aortic arch. There is no aneurysm, dissection or hemodynamically significant stenosis of the visualized portion of the aorta. Conventional 3 vessel aortic branching pattern. The visualized proximal subclavian arteries are widely patent. RIGHT CAROTID SYSTEM: Normal without aneurysm, dissection or stenosis. LEFT CAROTID SYSTEM: Normal without aneurysm, dissection or stenosis. VERTEBRAL ARTERIES: Left dominant configuration. Both origins are clearly patent. There is no dissection, occlusion or flow-limiting stenosis to the skull base (V1-V3 segments). CTA HEAD FINDINGS POSTERIOR CIRCULATION: --Vertebral arteries: Normal V4 segments. --Inferior cerebellar  arteries: Normal. --Basilar artery: Normal. --Superior cerebellar arteries: Normal. --Posterior cerebral arteries (PCA): Normal. ANTERIOR CIRCULATION: --Intracranial internal carotid arteries: Normal. --Anterior cerebral arteries (ACA): Normal. Both A1 segments are present. Patent anterior communicating artery (a-comm). --Middle cerebral arteries (MCA): Normal. VENOUS SINUSES: As permitted by contrast timing, patent. ANATOMIC VARIANTS: None Review of the MIP images confirms the above findings. IMPRESSION: Normal CTA of the head and neck. Electronically Signed   By: Ulyses Jarred M.D.   On: 06/01/2022 20:38   CT Head Wo Contrast  Result Date: 06/01/2022 CLINICAL DATA:  Headache, sudden, severe. EXAM: CT HEAD WITHOUT CONTRAST TECHNIQUE: Contiguous axial images were obtained from the base of the skull through the vertex without intravenous contrast. RADIATION DOSE REDUCTION: This exam was performed according to the departmental dose-optimization program which  includes automated exposure control, adjustment of the mA and/or kV according to patient size and/or use of iterative reconstruction technique. COMPARISON:  None Available. FINDINGS: Brain: No acute hemorrhage, mass effect or midline shift. Gray-white differentiation is preserved. No hydrocephalus. No extra-axial collection. Basilar cisterns are patent. Vascular: No hyperdense vessel or unexpected calcification. Skull: No calvarial fracture or suspicious bone lesion. Skull base is unremarkable. Sinuses/Orbits: Mild mucosal disease in the ethmoid sinuses. Orbits are unremarkable. Mastoids are well aerated. Other: None. IMPRESSION: No acute intracranial abnormality. Electronically Signed   By: Emmit Alexanders M.D.   On: 06/01/2022 19:24    Procedures Procedures    Medications Ordered in ED Medications  ketorolac (TORADOL) 30 MG/ML injection 30 mg (has no administration in time range)  acetaminophen (TYLENOL) tablet 1,000 mg (has no administration in  time range)  metoCLOPramide (REGLAN) injection 10 mg (10 mg Intravenous Given 06/01/22 1852)  diphenhydrAMINE (BENADRYL) injection 25 mg (25 mg Intravenous Given 06/01/22 1852)  0.9 %  sodium chloride infusion ( Intravenous New Bag/Given 06/01/22 1850)  iohexol (OMNIPAQUE) 300 MG/ML solution 100 mL (75 mLs Intravenous Contrast Given 06/01/22 2006)    ED Course/ Medical Decision Making/ A&P                             Medical Decision Making Amount and/or Complexity of Data Reviewed Labs: ordered. Radiology: ordered.  Risk OTC drugs. Prescription drug management.   Is a severe headache.  He reports that as of acute onset this morning while he was sleeping.  He has prior headache history.  He has photophobia.  No confusion.  No fever.  Concern for subarachnoid hemorrhage.  Will proceed with CT scan.  Initiate pain control combination of Reglan and Benadryl and fluids.  Patient got some improvement with Benadryl Reglan and fluids.  CT angiogram head and neck negative for aneurysm or dissection.  No other acute findings no acute bleed present.  Patient did not have any history of fever or neck stiffness.  He does not have meningismus.  He is not confused or somnolent.  This time I have low suspicion for meningitis or encephalopathy as cause for headache.  Headache was of acute onset but at this point with no aneurysm or dissection or bleed, low suspicion for intracerebral hemorrhage or subarachnoid hemorrhage.  Patient is normotensive and does not have hypertension.  Patient improved with treatment.  At this point he does not have significant photophobia he does not have meningismus and feels more comfortable.  At this point I feel he is safe for discharge.  Patient is working on PCP follow-up.  He is advised to follow-up this week if possible, careful return precautions reviewed.  We reviewed a plan of using ibuprofen and Tylenol at home for pain control and zofran for nausea if  needed.        Final Clinical Impression(s) / ED Diagnoses Final diagnoses:  Bad headache    Rx / DC Orders ED Discharge Orders          Ordered    ondansetron (ZOFRAN-ODT) 4 MG disintegrating tablet  Every 8 hours PRN        06/01/22 2134    ibuprofen (ADVIL) 800 MG tablet  3 times daily        06/01/22 2134              Charlesetta Shanks, MD 06/01/22 2141

## 2022-06-01 NOTE — ED Notes (Signed)
Patient at CT

## 2022-06-01 NOTE — ED Triage Notes (Signed)
Pt reports waking up with headache. Also endorses CP for a few days. Denies SOB. Pt has light sensitivity.

## 2022-06-01 NOTE — ED Notes (Signed)
Reviewed AVS with patient, patient expressed understanding of directions, denies further questions at this time. 

## 2022-06-01 NOTE — Discharge Instructions (Signed)
1.  You had a very bad headache in the emergency department.  Your CT scan did not show evidence of an aneurysm or brain bleed.  Return immediately for recheck if you get new or concerning symptoms. 2.  You may take ibuprofen every 8 hours as prescribed for headache.  You may also take extra strength Tylenol per package instructions every 6 hours to help with headache.  His medications may be taken together, they work differently.  You may take Zofran if needed for nausea.  Rest and hydrate for the next 24 to 48 hours. 3.  You need to follow-up with your family doctor.  Make an appointment as soon as possible.  Return to the emergency department if you have concerning symptoms and need a recheck.

## 2022-08-19 ENCOUNTER — Emergency Department (HOSPITAL_BASED_OUTPATIENT_CLINIC_OR_DEPARTMENT_OTHER)
Admission: EM | Admit: 2022-08-19 | Discharge: 2022-08-19 | Disposition: A | Discharge status: Home / Self Care | Payer: 59 | Attending: Emergency Medicine | Admitting: Emergency Medicine

## 2022-08-19 ENCOUNTER — Other Ambulatory Visit: Payer: Self-pay

## 2022-08-19 ENCOUNTER — Encounter (HOSPITAL_BASED_OUTPATIENT_CLINIC_OR_DEPARTMENT_OTHER): Payer: Self-pay | Admitting: Emergency Medicine

## 2022-08-19 DIAGNOSIS — R1033 Periumbilical pain: Secondary | ICD-10-CM | POA: Diagnosis present

## 2022-08-19 LAB — COMPREHENSIVE METABOLIC PANEL
ALT: 7 U/L (ref 0–44)
AST: 9 U/L — ABNORMAL LOW (ref 15–41)
Albumin: 4.3 g/dL (ref 3.5–5.0)
Alkaline Phosphatase: 64 U/L (ref 38–126)
Anion gap: 7 (ref 5–15)
BUN: 12 mg/dL (ref 6–20)
CO2: 26 mmol/L (ref 22–32)
Calcium: 9.3 mg/dL (ref 8.9–10.3)
Chloride: 107 mmol/L (ref 98–111)
Creatinine, Ser: 1.09 mg/dL (ref 0.61–1.24)
GFR, Estimated: >60 mL/min (ref >60)
Glucose, Bld: 96 mg/dL (ref 70–99)
Potassium: 3.6 mmol/L (ref 3.5–5.1)
Sodium: 140 mmol/L (ref 135–145)
Total Bilirubin: 0.3 mg/dL (ref 0.3–1.2)
Total Protein: 6.7 g/dL (ref 6.5–8.1)

## 2022-08-19 LAB — CBC
HCT: 38 % — ABNORMAL LOW (ref 39.0–52.0)
Hemoglobin: 13.1 g/dL (ref 13.0–17.0)
MCH: 32 pg (ref 26.0–34.0)
MCHC: 34.5 g/dL (ref 30.0–36.0)
MCV: 92.7 fL (ref 80.0–100.0)
Platelets: 231 10*3/uL (ref 150–400)
RBC: 4.1 MIL/uL — ABNORMAL LOW (ref 4.22–5.81)
RDW: 14.4 % (ref 11.5–15.5)
WBC: 5 10*3/uL (ref 4.0–10.5)
nRBC: 0 % (ref 0.0–0.2)

## 2022-08-19 LAB — LIPASE, BLOOD: Lipase: 15 U/L (ref 11–51)

## 2022-08-19 NOTE — ED Triage Notes (Signed)
Pt  here from home with c/o abd pain times 1 month .pain usually around his naval , no n/v

## 2022-08-19 NOTE — ED Provider Notes (Signed)
[place] [facility] AT [facility] Provider Note   CSN: [id number] Arrival date & time: 08/19/22  7425     History  Chief Complaint  Patient presents with   Abdominal Pain    Luke Black is a 52 y.o. male.   Abdominal Pain    52 year old male presenting to the emergency department with periumbilical abdominal pain.  The patient states that this pain has been ongoing for the past 1-1/2 to 2 months.  He states that he occasionally notices a bulge above his umbilicus.  He rubs on it and pushes on it and when he goes back and his pain improves.  He describes the pain when it happens as sharp.  No nausea or vomiting.  His last bowel movement was yesterday was normal and he is currently passing gas.  He is currently pain-free.  He presented to the emergency department today for evaluation as he had questions as to what could be causing the pain.  Home Medications Prior to Admission medications   Medication Sig Start Date End Date Taking? Authorizing Provider  cyclobenzaprine (FLEXERIL) 10 MG tablet Take 1 tablet (10 mg total) by mouth 3 (three) times daily as needed for muscle spasms. 01/30/22   Geoffery Lyons, MD  ibuprofen (ADVIL) 800 MG tablet Take 1 tablet (800 mg total) by mouth 3 (three) times daily. 06/01/22   Arby Barrette, MD  naproxen (NAPROSYN) 500 MG tablet Take 1 tablet (500 mg total) by mouth 2 (two) times daily. 01/30/22   Geoffery Lyons, MD  omeprazole (PRILOSEC) 20 MG capsule Take 1 capsule (20 mg total) by mouth daily. 03/22/21   Rolan Bucco, MD  ondansetron (ZOFRAN-ODT) 4 MG disintegrating tablet Take 1 tablet (4 mg total) by mouth every 8 (eight) hours as needed for nausea or vomiting. 06/01/22   Arby Barrette, MD      Allergies    Patient has no known allergies.    Review of Systems   Review of Systems  Gastrointestinal:  Positive for abdominal pain.  All other systems reviewed and are negative.   Physical Exam Updated Vital  Signs BP 118/80 (BP Location: Right Arm)   Pulse 78   Temp 98.6 F (37 C) (Oral)   Resp 18   Ht 6' (1.829 m)   Wt 82.6 kg   SpO2 97%   BMI 24.68 kg/m  Physical Exam Vitals and nursing note reviewed.  Constitutional:      General: He is not in acute distress.    Appearance: He is well-developed.  HENT:     Head: Normocephalic and atraumatic.  Eyes:     Conjunctiva/sclera: Conjunctivae normal.  Cardiovascular:     Rate and Rhythm: Normal rate and regular rhythm.     Heart sounds: No murmur heard. Pulmonary:     Effort: Pulmonary effort is normal. No respiratory distress.     Breath sounds: Normal breath sounds.  Abdominal:     Palpations: Abdomen is soft.     Tenderness: There is no abdominal tenderness. There is no guarding or rebound.     Comments: No palpable hernia, no rebound or guarding, no abdominal tenderness  Musculoskeletal:        General: No swelling.     Cervical back: Neck supple.  Skin:    General: Skin is warm and dry.     Capillary Refill: Capillary refill takes less than 2 seconds.  Neurological:     Mental Status: He is alert.  Psychiatric:  Mood and Affect: Mood normal.     ED Results / Procedures / Treatments   Labs (all labs ordered are listed, but only abnormal results are displayed) Labs Reviewed  COMPREHENSIVE METABOLIC PANEL - Abnormal; Notable for the following components:      Result Value   AST 9 (*)    All other components within normal limits  CBC - Abnormal; Notable for the following components:   RBC 4.10 (*)    HCT 38.0 (*)    All other components within normal limits  LIPASE, BLOOD    EKG None  Radiology No results found.  Procedures Procedures    Medications Ordered in ED Medications - No data to display  ED Course/ Medical Decision Making/ A&P                             Medical Decision Making Amount and/or Complexity of Data Reviewed Labs: ordered.    52 year old male presenting to the emergency  department with periumbilical abdominal pain.  The patient states that this pain has been ongoing for the past 1-1/2 to 2 months.  He states that he occasionally notices a bulge above his umbilicus.  He rubs on it and pushes on it and when he goes back and his pain improves.  He describes the pain when it happens as sharp.  No nausea or vomiting.  His last bowel movement was yesterday was normal and he is currently passing gas.  He is currently pain-free.  He presented to the emergency department today for evaluation as he had questions as to what could be causing the pain.  On arrival, the patient was vitally stable.  Presenting with intermittent chronic abdominal sharp crampy discomfort in the setting of an occasional bulge above his umbilicus.  What he is describing certainly sounds like a ventral abdominal wall hernia.  No clear hernia palpated on exam today.  The patient is currently asymptomatic without any discomfort and he has a reassuring abdominal exam.  I do not think further imaging workup is indicated at this time.  Laboratory evaluation significant for CBC without a leukocytosis or anemia, lipase normal, CMP unremarkable.  Low concern for acute intra-abdominal abnormality requiring further evaluation and management based on the patient's history of present illness and physical exam and laboratory evaluation.  I recommended the patient follow-up outpatient with general surgery for repeat evaluation, return precautions provided in the event of any severe worsening of symptoms.  Stable for discharge.  The patient has been appropriately medically screened and/or stabilized in the ED. I have low suspicion for any other emergent medical condition which would require further screening, evaluation or treatment in the ED or require inpatient management.   Final Clinical Impression(s) / ED Diagnoses Final diagnoses:  Periumbilical abdominal pain    Rx / DC Orders ED Discharge Orders           Ordered    Ambulatory referral to General Surgery        08/19/22 1031              Ernie Avena, MD 08/19/22 1038

## 2022-08-19 NOTE — Discharge Instructions (Signed)
Please follow-up outpatient with a general surgeon.  Your history of present illness describes what sounds to be like an abdominal wall hernia that is intermittently causing the pain and discomfort.  Your exam today was reassuring as was your laboratory workup.  If you develop a bulge in your abdominal wall with associated pain and you cannot push that bulge back in, return to the nearest emergency department for emergent CT imaging.  Otherwise follow-up outpatient with general surgery as sometimes abdominal wall defects can be electively repaired.

## 2022-08-23 ENCOUNTER — Other Ambulatory Visit: Payer: Self-pay

## 2022-08-23 ENCOUNTER — Encounter (HOSPITAL_BASED_OUTPATIENT_CLINIC_OR_DEPARTMENT_OTHER): Payer: Self-pay | Admitting: *Deleted

## 2022-08-23 ENCOUNTER — Emergency Department (HOSPITAL_BASED_OUTPATIENT_CLINIC_OR_DEPARTMENT_OTHER)
Admission: EM | Admit: 2022-08-23 | Discharge: 2022-08-23 | Disposition: A | Discharge status: Home / Self Care | Payer: 59 | Attending: Emergency Medicine | Admitting: Emergency Medicine

## 2022-08-23 DIAGNOSIS — M545 Low back pain, unspecified: Secondary | ICD-10-CM | POA: Diagnosis present

## 2022-08-23 HISTORY — DX: Cardiac murmur, unspecified: R01.1

## 2022-08-23 MED ORDER — CYCLOBENZAPRINE HCL 10 MG PO TABS: 10.0000 mg | ORAL_TABLET | Freq: Two times a day (BID) | ORAL | 0 refills | Status: DC | PRN

## 2022-08-23 MED ORDER — KETOROLAC TROMETHAMINE 30 MG/ML IJ SOLN
30.0000 mg | Freq: Once | INTRAMUSCULAR | Status: AC
  Administered 2022-08-23: 30 mg via INTRAMUSCULAR
  Filled 2022-08-23: qty 1

## 2022-08-23 MED ORDER — NAPROXEN 500 MG PO TABS: 500.0000 mg | ORAL_TABLET | Freq: Two times a day (BID) | ORAL | 0 refills | Status: DC

## 2022-08-23 NOTE — ED Provider Notes (Signed)
[place] EM[facility]ENC[facility] DEPARTMENT AT [facility]  Provider Note  CSN: [id number] Arrival date & time: 08/23/22 0544  History Chief Complaint  Patient presents with   Back Pain    Luke Black is a 52 y.o. male with no significant PMH presents for evaluation of low back pain. Reports onset of left lower back pain after playing basketball yesterday. Pain is worse with bending/twisting. Does not radiate. Similar to back pain he was seen for in Nov 2023, given flexeril then with good improvement. He was also seen just a few days ago for a suspected ventral hernia, but that is not currently bothering him. Denies fever tonight. No N/V, no problems with bowels or bladder. No numbness or weakness.    Home Medications Prior to Admission medications   Medication Sig Start Date End Date Taking? Authorizing Provider  cyclobenzaprine (FLEXERIL) 10 MG tablet Take 1 tablet (10 mg total) by mouth 2 (two) times daily as needed for muscle spasms. 08/23/22  Yes Pollyann Savoy, MD  naproxen (NAPROSYN) 500 MG tablet Take 1 tablet (500 mg total) by mouth 2 (two) times daily. 08/23/22  Yes Pollyann Savoy, MD     Allergies    Patient has no known allergies.   Review of Systems   Review of Systems Please see HPI for pertinent positives and negatives  Physical Exam BP 119/77   Pulse 79   Temp 98.6 F (37 C)   Resp 20   Ht 6' (1.829 m)   Wt 78.5 kg   SpO2 99%   BMI 23.46 kg/m   Physical Exam Vitals and nursing note reviewed.  HENT:     Head: Normocephalic.     Nose: Nose normal.  Eyes:     Extraocular Movements: Extraocular movements intact.  Pulmonary:     Effort: Pulmonary effort is normal.  Musculoskeletal:        General: Tenderness (L lumbar paraspinal muscles) present. Normal range of motion.     Cervical back: Neck supple.  Skin:    Findings: No rash (on exposed skin).  Neurological:     Mental Status: He is alert and oriented to person, place, and time.      Cranial Nerves: No cranial nerve deficit.     Sensory: No sensory deficit.     Motor: No weakness.     Deep Tendon Reflexes: Reflexes normal.  Psychiatric:        Mood and Affect: Mood normal.     ED Results / Procedures / Treatments   EKG None  Procedures Procedures  Medications Ordered in the ED Medications  ketorolac (TORADOL) 30 MG/ML injection 30 mg (30 mg Intramuscular Given 08/23/22 0610)    Initial Impression and Plan  Patient here for MSK low back pain. No concern for acute cord compression. Plan IM toradol. Rx for Naprosyn/Flexeril, recommend rest, heat and PCP follow up, RTED for any other concerns.    ED Course       MDM Rules/Calculators/A&P Medical Decision Making Problems Addressed: Acute left-sided low back pain without sciatica: acute illness or injury  Risk Prescription drug management.     Final Clinical Impression(s) / ED Diagnoses Final diagnoses:  Acute left-sided low back pain without sciatica    Rx / DC Orders ED Discharge Orders          Ordered    naproxen (NAPROSYN) 500 MG tablet  2 times daily        08/23/22 0610    cyclobenzaprine (FLEXERIL)  10 MG tablet  2 times daily PRN        08/23/22 0610             Pollyann Savoy, MD 08/23/22 [phone]

## 2022-08-23 NOTE — ED Triage Notes (Addendum)
C/o left lower back pain that started yesterday. States pain comes and goes. Denies any injury. Last dose of ibuprofen was last night. Describes as a spasm. Denies any urinary symptoms. States he did have chills yesterday. C/o cough.

## 2022-12-05 ENCOUNTER — Emergency Department (HOSPITAL_BASED_OUTPATIENT_CLINIC_OR_DEPARTMENT_OTHER): Payer: 59

## 2022-12-05 ENCOUNTER — Encounter (HOSPITAL_BASED_OUTPATIENT_CLINIC_OR_DEPARTMENT_OTHER): Payer: Self-pay | Admitting: Emergency Medicine

## 2022-12-05 ENCOUNTER — Emergency Department (HOSPITAL_BASED_OUTPATIENT_CLINIC_OR_DEPARTMENT_OTHER)
Admission: EM | Admit: 2022-12-05 | Discharge: 2022-12-05 | Disposition: A | Discharge status: Home / Self Care | Payer: 59 | Attending: Emergency Medicine | Admitting: Emergency Medicine

## 2022-12-05 ENCOUNTER — Other Ambulatory Visit: Payer: Self-pay

## 2022-12-05 DIAGNOSIS — K8020 Calculus of gallbladder without cholecystitis without obstruction: Secondary | ICD-10-CM | POA: Insufficient documentation

## 2022-12-05 DIAGNOSIS — K920 Hematemesis: Secondary | ICD-10-CM | POA: Insufficient documentation

## 2022-12-05 DIAGNOSIS — D3500 Benign neoplasm of unspecified adrenal gland: Secondary | ICD-10-CM | POA: Diagnosis not present

## 2022-12-05 DIAGNOSIS — R1010 Upper abdominal pain, unspecified: Secondary | ICD-10-CM

## 2022-12-05 LAB — COMPREHENSIVE METABOLIC PANEL
ALT: 9 U/L (ref 0–44)
AST: 13 U/L — ABNORMAL LOW (ref 15–41)
Albumin: 4.5 g/dL (ref 3.5–5.0)
Alkaline Phosphatase: 65 U/L (ref 38–126)
Anion gap: 10 (ref 5–15)
BUN: 19 mg/dL (ref 6–20)
CO2: 25 mmol/L (ref 22–32)
Calcium: 9.7 mg/dL (ref 8.9–10.3)
Chloride: 106 mmol/L (ref 98–111)
Creatinine, Ser: 1.14 mg/dL (ref 0.61–1.24)
GFR, Estimated: >60 mL/min (ref >60)
Glucose, Bld: 76 mg/dL (ref 70–99)
Potassium: 3.6 mmol/L (ref 3.5–5.1)
Sodium: 141 mmol/L (ref 135–145)
Total Bilirubin: 0.5 mg/dL (ref 0.3–1.2)
Total Protein: 7.5 g/dL (ref 6.5–8.1)

## 2022-12-05 LAB — CBC
HCT: 39.3 % (ref 39.0–52.0)
Hemoglobin: 13.7 g/dL (ref 13.0–17.0)
MCH: 31.9 pg (ref 26.0–34.0)
MCHC: 34.9 g/dL (ref 30.0–36.0)
MCV: 91.6 fL (ref 80.0–100.0)
Platelets: 225 10*3/uL (ref 150–400)
RBC: 4.29 MIL/uL (ref 4.22–5.81)
RDW: 13.8 % (ref 11.5–15.5)
WBC: 9.6 10*3/uL (ref 4.0–10.5)
nRBC: 0 % (ref 0.0–0.2)

## 2022-12-05 LAB — LIPASE, BLOOD: Lipase: 53 U/L — ABNORMAL HIGH (ref 11–51)

## 2022-12-05 MED ORDER — IOHEXOL 300 MG/ML  SOLN
100.0000 mL | Freq: Once | INTRAMUSCULAR | Status: AC | PRN
  Administered 2022-12-05: 100 mL via INTRAVENOUS

## 2022-12-05 MED ORDER — ONDANSETRON 4 MG PO TBDP: 4.0000 mg | ORAL_TABLET | Freq: Three times a day (TID) | ORAL | 0 refills | Status: DC | PRN

## 2022-12-05 MED ORDER — PANTOPRAZOLE SODIUM 20 MG PO TBEC: 20.0000 mg | DELAYED_RELEASE_TABLET | Freq: Every day | ORAL | 0 refills | Status: DC

## 2022-12-05 MED ORDER — FAMOTIDINE 20 MG PO TABS: 20.0000 mg | ORAL_TABLET | Freq: Two times a day (BID) | ORAL | 0 refills | Status: DC

## 2022-12-05 NOTE — ED Provider Notes (Signed)
[place] EMERGENCY DEPARTMENT AT [facility] Provider Note   CSN: [id number] Arrival date & time: 12/05/22  1804     History  Chief Complaint  Patient presents with   Hematemesis    Luke Black is a 52 y.o. male.  Patient with history of tobacco use, occasional alcohol use presents to the emergency department for epigastric pain intermittently for about 2 months, initiation of vomiting today with bright red blood x 1.  He denies dark or black stools.  Pain is currently mild.  He thinks that he is lost 5 pounds and overtly over the past 1 month.  No headache, chest pain or shortness of breath.  No lightheadedness or syncope.  No urinary symptoms.  He denies heavy NSAID use.  No history of peptic ulcer disease.       Home Medications Prior to Admission medications   Medication Sig Start Date End Date Taking? Authorizing Provider  cyclobenzaprine (FLEXERIL) 10 MG tablet Take 1 tablet (10 mg total) by mouth 2 (two) times daily as needed for muscle spasms. 08/23/22   Pollyann Savoy, MD  naproxen (NAPROSYN) 500 MG tablet Take 1 tablet (500 mg total) by mouth 2 (two) times daily. 08/23/22   Pollyann Savoy, MD      Allergies    Patient has no known allergies.    Review of Systems   Review of Systems  Physical Exam Updated Vital Signs BP 112/74   Pulse 77   Temp 98.9 F (37.2 C) (Oral)   Resp 18   Ht 6\' 6"  (1.981 m)   Wt 78.5 kg   SpO2 95%   BMI 19.99 kg/m  Physical Exam Vitals and nursing note reviewed.  Constitutional:      General: He is not in acute distress.    Appearance: He is well-developed.  HENT:     Head: Normocephalic and atraumatic.  Eyes:     General:        Right eye: No discharge.        Left eye: No discharge.     Conjunctiva/sclera: Conjunctivae normal.  Cardiovascular:     Rate and Rhythm: Normal rate and regular rhythm.     Heart sounds: Normal heart sounds.  Pulmonary:     Effort: Pulmonary effort is normal.     Breath  sounds: Normal breath sounds.  Abdominal:     Palpations: Abdomen is soft.     Tenderness: There is abdominal tenderness. There is no guarding or rebound.     Comments: Mild tenderness in the right upper quadrant, epigastrium, left upper quadrant  Musculoskeletal:     Cervical back: Normal range of motion and neck supple.  Skin:    General: Skin is warm and dry.  Neurological:     Mental Status: He is alert.     ED Results / Procedures / Treatments   Labs (all labs ordered are listed, but only abnormal results are displayed) Labs Reviewed  LIPASE, BLOOD - Abnormal; Notable for the following components:      Result Value   Lipase 53 (*)    All other components within normal limits  COMPREHENSIVE METABOLIC PANEL - Abnormal; Notable for the following components:   AST 13 (*)    All other components within normal limits  CBC    EKG None  Radiology CT ABDOMEN PELVIS W CONTRAST  Result Date: 12/05/2022 CLINICAL DATA:  Acute abdominal pain and hematemesis, initial encounter EXAM: CT ABDOMEN AND PELVIS WITH CONTRAST TECHNIQUE:  Multidetector CT imaging of the abdomen and pelvis was performed using the standard protocol following bolus administration of intravenous contrast. RADIATION DOSE REDUCTION: This exam was performed according to the departmental dose-optimization program which includes automated exposure control, adjustment of the mA and/or kV according to patient size and/or use of iterative reconstruction technique. CONTRAST:  OMNIPAQUE IOHEXOL 300 MG/ML  SOLN COMPARISON:  None Available. FINDINGS: Lower chest: No acute abnormality. Hepatobiliary: Gallbladder is decompressed with gallstones within. Liver is within normal limits. Pancreas: Unremarkable. No pancreatic ductal dilatation or surrounding inflammatory changes. Spleen: Normal in size without focal abnormality. Adrenals/Urinary Tract: Adrenal glands are well visualized. A 14 mm nodule is noted within the left adrenal  gland measuring 87 Hounsfield units. This likely represents an adenoma. Normal enhancement of the kidneys is noted bilaterally. No renal calculi are seen. The bladder is partially distended. Stomach/Bowel: The appendix is within normal limits. No obstructive or inflammatory changes of colon are seen. Small bowel and stomach are unremarkable. Vascular/Lymphatic: Aortic atherosclerosis. No enlarged abdominal or pelvic lymph nodes. Reproductive: Prostate is unremarkable. Other: No abdominal wall hernia or abnormality. No abdominopelvic ascites. Musculoskeletal: No acute or significant osseous findings. IMPRESSION: Cholelithiasis without complicating factors. Left adrenal mass measuring 1.4 cm, probable benign adenoma. Recommend follow-up adrenal washout CT in 1 year. If stable for = 1 year, no further follow-up imaging. JACR 2017 Aug; 14(8):1038-44, JCAT 2016 Mar-Apr; 40(2):194-200, Urol J 2006 Spring; 3(2):71-4. No other focal abnormality is noted. Electronically Signed   By: Alcide Clever M.D.   On: 12/05/2022 20:51    Procedures Procedures    Medications Ordered in ED Medications  iohexol (OMNIPAQUE) 300 MG/ML solution 100 mL (100 mLs Intravenous Contrast Given 12/05/22 2036)    ED Course/ Medical Decision Making/ A&P    Patient seen and examined. History obtained directly from patient. Work-up including labs, imaging, EKG ordered in triage, if performed, were reviewed.    Labs/EKG: Independently reviewed and interpreted.  This included: CBC with normal white blood cell count, hemoglobin 13.7 normal, normocytic; CMP unremarkable; lipase minimally elevated at 53.  Imaging: Ordered CT of the abdomen pelvis with contrast  Medications/Fluids: None ordered  Most recent vital signs reviewed and are as follows: BP 112/74   Pulse 77   Temp 98.9 F (37.2 C) (Oral)   Resp 18   Ht 6\' 6"  (1.981 m)   Wt 78.5 kg   SpO2 95%   BMI 19.99 kg/m   Initial impression: Discussed empiric treatment with PPI  and GI follow-up as outpatient versus additional evaluation with CT imaging today.  Patient and family member at bedside would like to obtain imaging prior to discharge today.  I feel this is reasonable given his ongoing pain and weight loss.  If negative, will plan for PPI and H2 blocker, avoidance of alcohol and NSAIDs, GI follow-up.  9:24 PM Reassessment performed. Patient appears stable.  Upon entering room, family member is removing the patient's IV and bandaging arm.  Patient is anxious to go.  Imaging personally visualized and interpreted including: CT abdomen pelvis, agree gallstones, no signs of bowel infection.  Reviewed pertinent lab work and imaging with patient at bedside. Questions answered.   Most current vital signs reviewed and are as follows: BP 121/77   Pulse 82   Temp 98.9 F (37.2 C) (Oral)   Resp 18   Ht 6\' 6"  (1.981 m)   Wt 78.5 kg   SpO2 98%   BMI 19.99 kg/m   Plan: Discharge to  home.   Prescriptions written for: Protonix, Pepcid, Zofran  Other home care instructions discussed: Avoid NSAIDs, alcohol, bland diet  ED return instructions discussed: The patient was urged to return to the Emergency Department immediately with worsening of current symptoms, worsening abdominal pain, persistent vomiting, blood noted in stools, fever, or any other concerns. The patient verbalized understanding.   Follow-up instructions discussed: Patient encouraged to follow-up with their PCP or GI referral in 1 week.                                  Medical Decision Making Amount and/or Complexity of Data Reviewed Labs: ordered. Radiology: ordered.  Risk Prescription drug management.   For this patient's complaint of abdominal pain, the following conditions were considered on the differential diagnosis: gastritis/PUD, enteritis/duodenitis, appendicitis, cholelithiasis/cholecystitis, cholangitis, pancreatitis, ruptured viscus, colitis, diverticulitis, small/large bowel  obstruction, proctitis, cystitis, pyelonephritis, ureteral colic, aortic dissection, aortic aneurysm. Atypical chest etiologies were also considered including ACS, PE, and pneumonia.  The patient's vital signs, pertinent lab work and imaging were reviewed and interpreted as discussed in the ED course. Hospitalization was considered for further testing, treatments, or serial exams/observation. However as patient is well-appearing, has a stable exam, and reassuring studies today, I do not feel that they warrant admission at this time. This plan was discussed with the patient who verbalizes agreement and comfort with this plan and seems reliable and able to return to the Emergency Department with worsening or changing symptoms.          Final Clinical Impression(s) / ED Diagnoses Final diagnoses:  Hematemesis with nausea  Upper abdominal pain  Calculus of gallbladder without cholecystitis without obstruction  Adrenal adenoma, unspecified laterality    Rx / DC Orders ED Discharge Orders          Ordered    pantoprazole (PROTONIX) 20 MG tablet  Daily        12/05/22 2122    famotidine (PEPCID) 20 MG tablet  2 times daily        12/05/22 2122    ondansetron (ZOFRAN-ODT) 4 MG disintegrating tablet  Every 8 hours PRN        12/05/22 2122              Renne Crigler, PA-C 12/05/22 2125    Vanetta Mulders, MD 12/06/22 1530

## 2022-12-05 NOTE — ED Triage Notes (Signed)
Pt reports 2x hematemesis today. Pt endorses Abd pain intermittently x 2 months

## 2022-12-05 NOTE — Discharge Instructions (Signed)
Please read and follow all provided instructions.  Your diagnoses today include:  1. Hematemesis with nausea   2. Upper abdominal pain   3. Calculus of gallbladder without cholecystitis without obstruction   4. Adrenal adenoma, unspecified laterality     Tests performed today include: Blood cell counts and platelets Kidney and liver function tests Pancreas function test (called lipase) CT scan of the abdomen pelvis showed an adrenal adenoma which will need to be rechecked in 1 year, also shows gallstones which is unlikely to be related to your symptoms today, but please be aware that these were present Vital signs. See below for your results today.   Medications prescribed:  Pantoprazole (Protonix) - stomach acid reducer  Pepcid (famotidine) - antihistamine  You can find this medication over-the-counter.   DO NOT exceed:  20mg  Pepcid every 12 hours   Zofran (ondansetron) - for nausea and vomiting  Take any prescribed medications only as directed.  Home care instructions:  Follow any educational materials contained in this packet.  Follow-up instructions: Please follow-up with your primary care provider or gastroenterology referral in 1 week  Return instructions:  SEEK IMMEDIATE MEDICAL ATTENTION IF: The pain does not go away or becomes severe  A temperature above 101F develops  Repeated vomiting occurs (multiple episodes)  The pain becomes localized to portions of the abdomen. The right side could possibly be appendicitis. In an adult, the left lower portion of the abdomen could be colitis or diverticulitis.  Blood is being passed in stools or vomit (bright red or black tarry stools)  You develop chest pain, difficulty breathing, dizziness or fainting, or become confused, poorly responsive, or inconsolable (young children) If you have any other emergent concerns regarding your health  Additional Information: Abdominal (belly) pain can be caused by many things. Your  caregiver performed an examination and possibly ordered blood/urine tests and imaging (CT scan, x-rays, ultrasound). Many cases can be observed and treated at home after initial evaluation in the emergency department. Even though you are being discharged home, abdominal pain can be unpredictable. Therefore, you need a repeated exam if your pain does not resolve, returns, or worsens. Most patients with abdominal pain don't have to be admitted to the hospital or have surgery, but serious problems like appendicitis and gallbladder attacks can start out as nonspecific pain. Many abdominal conditions cannot be diagnosed in one visit, so follow-up evaluations are very important.  Your vital signs today were: BP 121/77   Pulse 82   Temp 98.9 F (37.2 C) (Oral)   Resp 18   Ht 6\' 6"  (1.981 m)   Wt 78.5 kg   SpO2 98%   BMI 19.99 kg/m  If your blood pressure (bp) was elevated above 135/85 this visit, please have this repeated by your doctor within one month. --------------

## 2022-12-26 IMAGING — CR DG CHEST 2V
2 series · 2 of 2 positions shown · non-contrast
Comparison: None.

CLINICAL DATA: Chest pain

EXAM:
CHEST - 2 VIEW

[w chest pa]
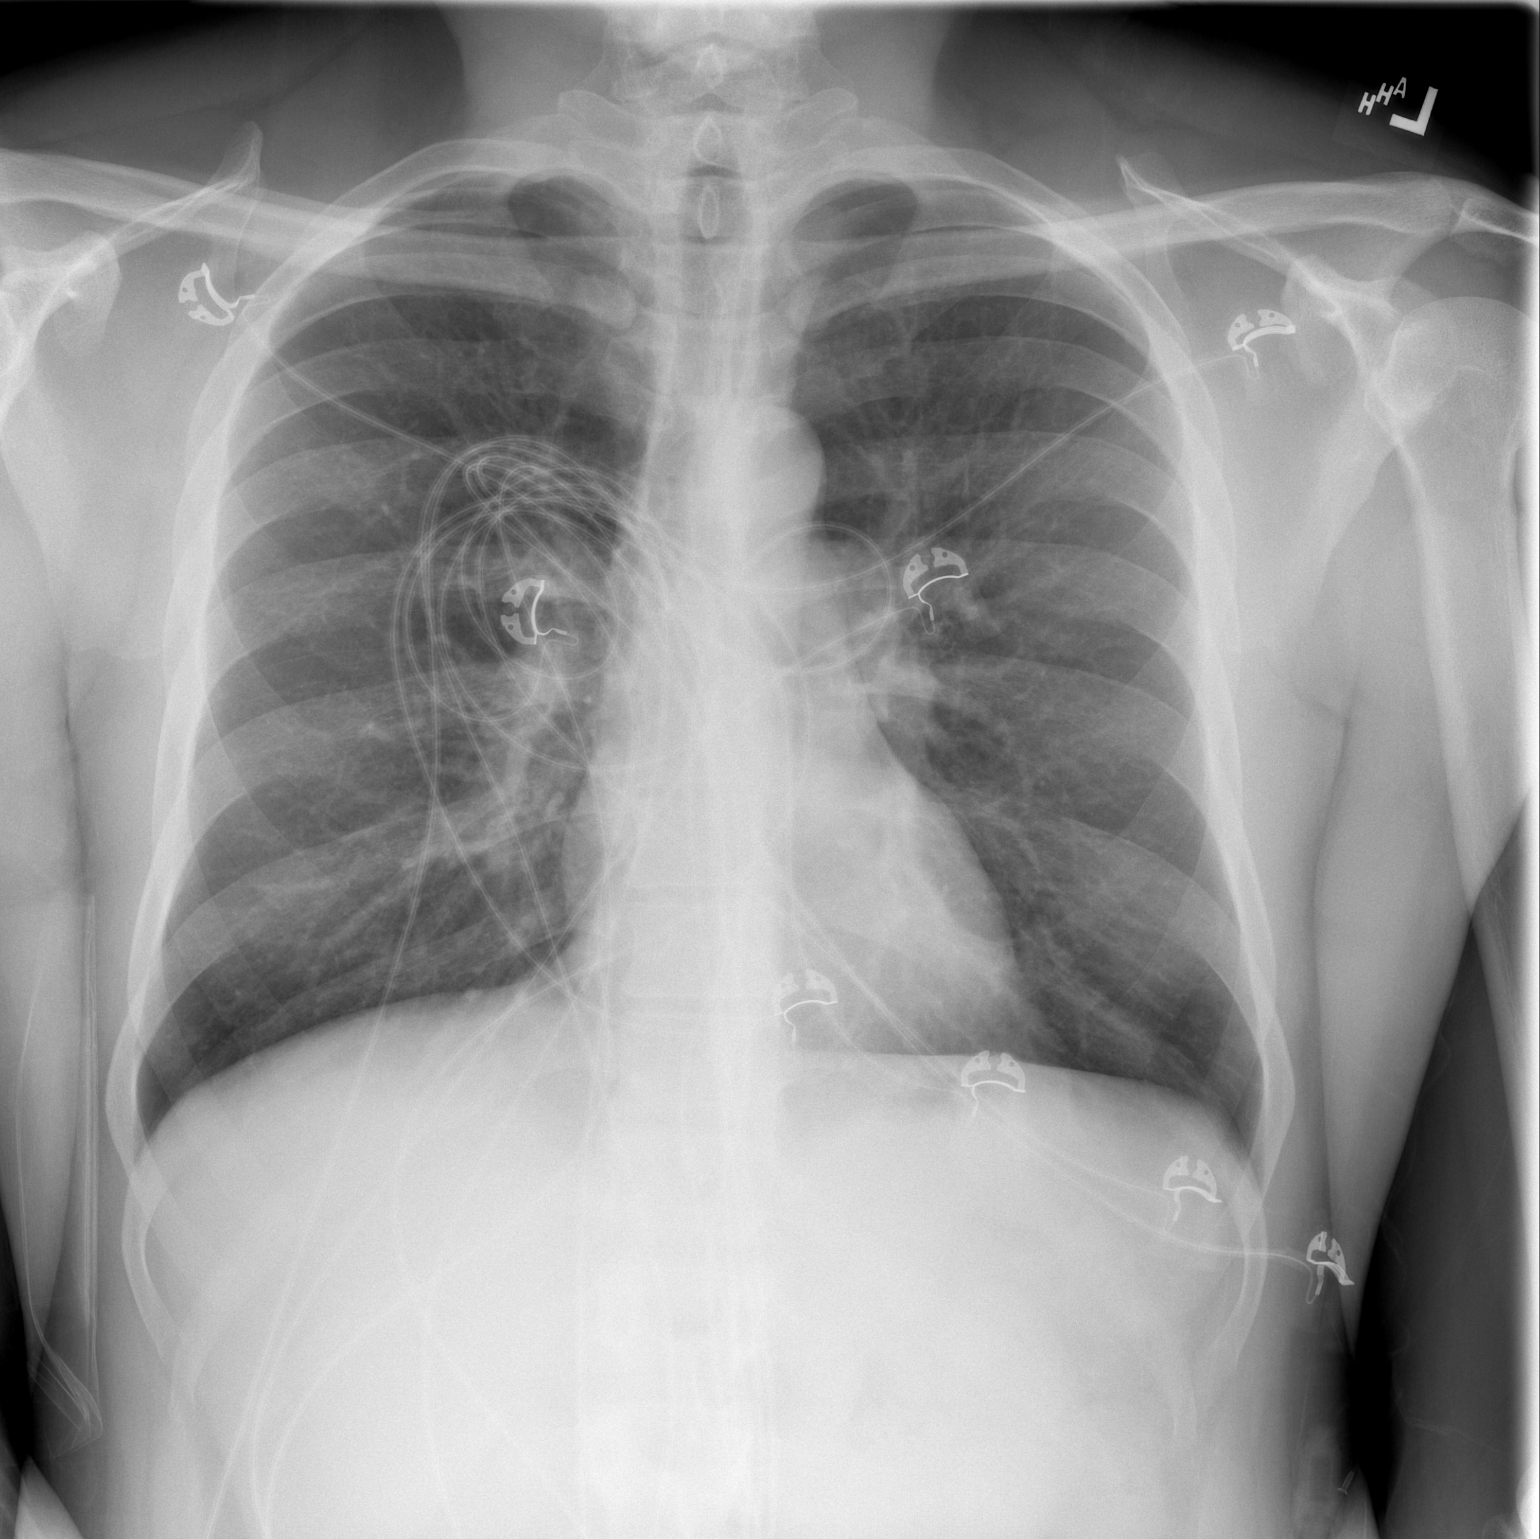

[w chest lat]
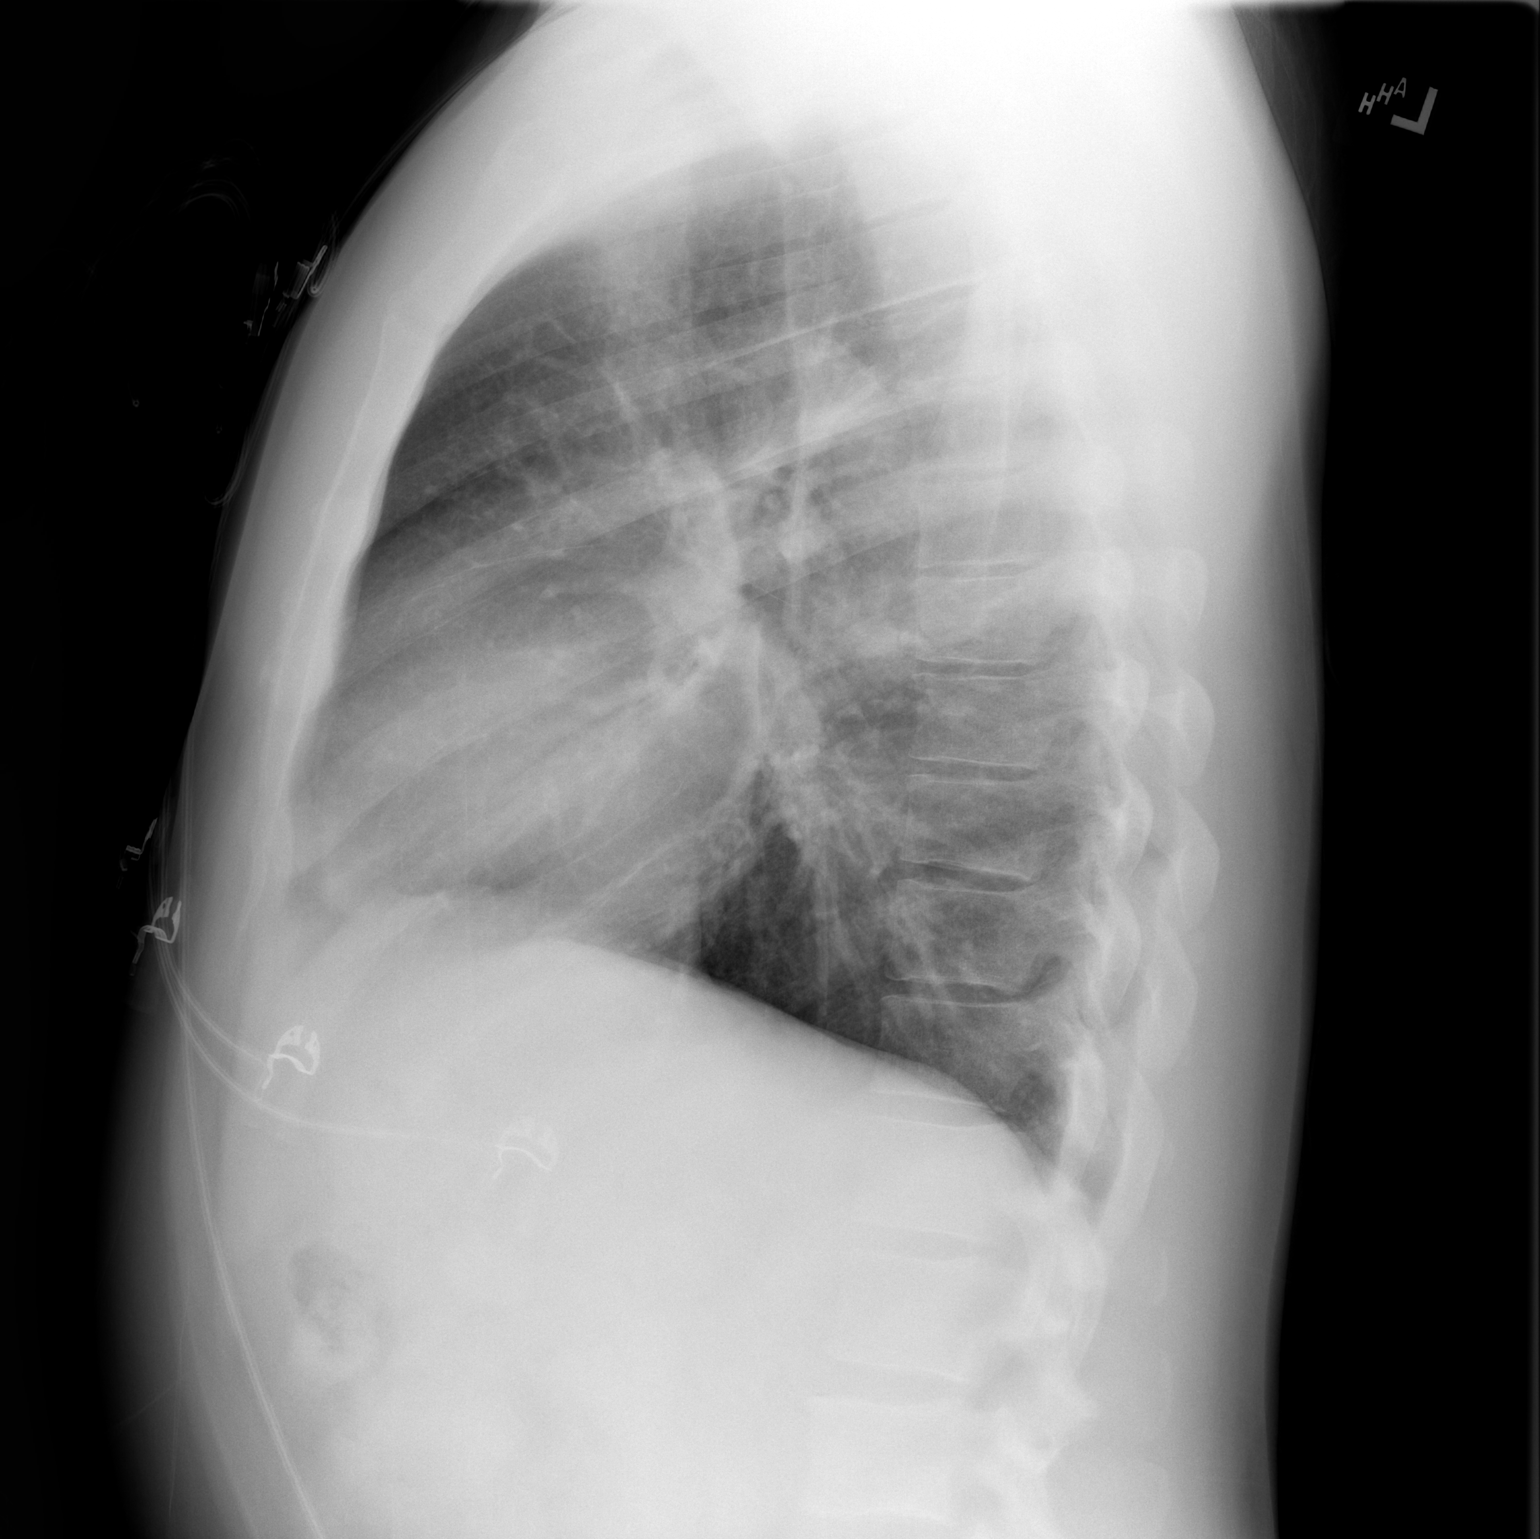

[2 of 2 positions shown; findings below may reference images not displayed]

FINDINGS: The heart size and mediastinal contours are within normal limits.
Both lungs are clear. The visualized skeletal structures are
unremarkable.
IMPRESSION: No active cardiopulmonary disease.

## 2023-05-07 ENCOUNTER — Emergency Department (HOSPITAL_BASED_OUTPATIENT_CLINIC_OR_DEPARTMENT_OTHER): Payer: 59

## 2023-05-07 ENCOUNTER — Encounter (HOSPITAL_BASED_OUTPATIENT_CLINIC_OR_DEPARTMENT_OTHER): Payer: Self-pay | Admitting: *Deleted

## 2023-05-07 ENCOUNTER — Emergency Department (HOSPITAL_BASED_OUTPATIENT_CLINIC_OR_DEPARTMENT_OTHER): Payer: 59 | Admitting: Radiology

## 2023-05-07 ENCOUNTER — Observation Stay (HOSPITAL_BASED_OUTPATIENT_CLINIC_OR_DEPARTMENT_OTHER)
Admission: EM | Admit: 2023-05-07 | Discharge: 2023-05-08 | Disposition: A | Discharge status: Home / Self Care | Payer: 59 | Attending: Internal Medicine | Admitting: Internal Medicine

## 2023-05-07 ENCOUNTER — Other Ambulatory Visit: Payer: Self-pay

## 2023-05-07 DIAGNOSIS — K56600 Partial intestinal obstruction, unspecified as to cause: Principal | ICD-10-CM | POA: Insufficient documentation

## 2023-05-07 DIAGNOSIS — K219 Gastro-esophageal reflux disease without esophagitis: Secondary | ICD-10-CM | POA: Diagnosis not present

## 2023-05-07 DIAGNOSIS — K56609 Unspecified intestinal obstruction, unspecified as to partial versus complete obstruction: Secondary | ICD-10-CM | POA: Diagnosis present

## 2023-05-07 DIAGNOSIS — Z79899 Other long term (current) drug therapy: Secondary | ICD-10-CM | POA: Diagnosis not present

## 2023-05-07 DIAGNOSIS — Z720 Tobacco use: Secondary | ICD-10-CM

## 2023-05-07 DIAGNOSIS — R109 Unspecified abdominal pain: Secondary | ICD-10-CM | POA: Diagnosis present

## 2023-05-07 DIAGNOSIS — F17210 Nicotine dependence, cigarettes, uncomplicated: Secondary | ICD-10-CM | POA: Diagnosis not present

## 2023-05-07 LAB — LIPASE, BLOOD: Lipase: 26 U/L (ref 11–51)

## 2023-05-07 LAB — CBC
HCT: 41.4 % (ref 39.0–52.0)
Hemoglobin: 13.9 g/dL (ref 13.0–17.0)
MCH: 32.4 pg (ref 26.0–34.0)
MCHC: 33.6 g/dL (ref 30.0–36.0)
MCV: 96.5 fL (ref 80.0–100.0)
Platelets: 227 10*3/uL (ref 150–400)
RBC: 4.29 MIL/uL (ref 4.22–5.81)
RDW: 14.4 % (ref 11.5–15.5)
WBC: 7.5 10*3/uL (ref 4.0–10.5)
nRBC: 0 % (ref 0.0–0.2)

## 2023-05-07 LAB — URINALYSIS, ROUTINE W REFLEX MICROSCOPIC
Bacteria, UA: NONE SEEN
Bilirubin Urine: NEGATIVE
Glucose, UA: NEGATIVE mg/dL
Hgb urine dipstick: NEGATIVE
Ketones, ur: NEGATIVE mg/dL
Nitrite: NEGATIVE
Specific Gravity, Urine: 1.024 (ref 1.005–1.030)
pH: 6 (ref 5.0–8.0)

## 2023-05-07 LAB — COMPREHENSIVE METABOLIC PANEL
ALT: 9 U/L (ref 0–44)
AST: 10 U/L — ABNORMAL LOW (ref 15–41)
Albumin: 4.8 g/dL (ref 3.5–5.0)
Alkaline Phosphatase: 63 U/L (ref 38–126)
Anion gap: 6 (ref 5–15)
BUN: 11 mg/dL (ref 6–20)
CO2: 32 mmol/L (ref 22–32)
Calcium: 9.7 mg/dL (ref 8.9–10.3)
Chloride: 103 mmol/L (ref 98–111)
Creatinine, Ser: 0.99 mg/dL (ref 0.61–1.24)
GFR, Estimated: >60 mL/min (ref >60)
Glucose, Bld: 98 mg/dL (ref 70–99)
Potassium: 3.9 mmol/L (ref 3.5–5.1)
Sodium: 141 mmol/L (ref 135–145)
Total Bilirubin: 0.4 mg/dL (ref 0.0–1.2)
Total Protein: 7.8 g/dL (ref 6.5–8.1)

## 2023-05-07 LAB — TROPONIN I (HIGH SENSITIVITY): Troponin I (High Sensitivity): 2 ng/L (ref <18)

## 2023-05-07 MED ORDER — IOHEXOL 300 MG/ML  SOLN
100.0000 mL | Freq: Once | INTRAMUSCULAR | Status: AC | PRN
  Administered 2023-05-07: 100 mL via INTRAVENOUS

## 2023-05-07 MED ORDER — ACETAMINOPHEN 650 MG RE SUPP: 650.0000 mg | Freq: Four times a day (QID) | RECTAL | Status: DC | PRN

## 2023-05-07 MED ORDER — ONDANSETRON HCL 4 MG/2ML IJ SOLN: 4.0000 mg | Freq: Four times a day (QID) | INTRAMUSCULAR | Status: DC | PRN

## 2023-05-07 MED ORDER — PANTOPRAZOLE SODIUM 40 MG IV SOLR
40.0000 mg | INTRAVENOUS | Status: DC
  Administered 2023-05-08: 40 mg via INTRAVENOUS
  Filled 2023-05-07: qty 10

## 2023-05-07 MED ORDER — MORPHINE SULFATE (PF) 2 MG/ML IV SOLN: 1.0000 mg | INTRAVENOUS | Status: DC | PRN

## 2023-05-07 MED ORDER — NALOXONE HCL 0.4 MG/ML IJ SOLN: 0.4000 mg | INTRAMUSCULAR | Status: DC | PRN

## 2023-05-07 MED ORDER — SODIUM CHLORIDE 0.9 % IV BOLUS
1000.0000 mL | Freq: Once | INTRAVENOUS | Status: AC
  Administered 2023-05-07: 1000 mL via INTRAVENOUS

## 2023-05-07 MED ORDER — SODIUM CHLORIDE 0.9 % IV SOLN: INTRAVENOUS | Status: AC

## 2023-05-07 MED ORDER — NICOTINE 21 MG/24HR TD PT24: 21.0000 mg | MEDICATED_PATCH | TRANSDERMAL | Status: DC

## 2023-05-07 MED ORDER — NICOTINE 21 MG/24HR TD PT24
21.0000 mg | MEDICATED_PATCH | Freq: Once | TRANSDERMAL | Status: AC
  Administered 2023-05-07: 21 mg via TRANSDERMAL
  Filled 2023-05-07: qty 1

## 2023-05-07 MED ORDER — ONDANSETRON HCL 4 MG/2ML IJ SOLN
4.0000 mg | Freq: Once | INTRAMUSCULAR | Status: AC
  Administered 2023-05-07: 4 mg via INTRAVENOUS
  Filled 2023-05-07: qty 2

## 2023-05-07 MED ORDER — PANTOPRAZOLE SODIUM 40 MG IV SOLR
40.0000 mg | Freq: Once | INTRAVENOUS | Status: AC
  Administered 2023-05-07: 40 mg via INTRAVENOUS
  Filled 2023-05-07: qty 10

## 2023-05-07 MED ORDER — ACETAMINOPHEN 325 MG PO TABS: 650.0000 mg | ORAL_TABLET | Freq: Four times a day (QID) | ORAL | Status: DC | PRN

## 2023-05-07 NOTE — ED Notes (Signed)
[facility] in route  

## 2023-05-07 NOTE — ED Notes (Signed)
 Patient transported to CT

## 2023-05-07 NOTE — ED Notes (Signed)
 [facility] called.

## 2023-05-07 NOTE — ED Notes (Addendum)
 Attempted to call report, nurse in another room, asked to call back for report.

## 2023-05-07 NOTE — H&P (Signed)
 History and Physical    Luke Black ZOX:[id number] DOB: 07-25-1970 DOA: 05/07/2023  PCP: Pcp, No  Patient coming from: [facility] ED  Chief Complaint: Abdominal pain  HPI: Luke Black is a 53 y.o. male with medical history significant of marijuana and tobacco abuse presented ED with complaints of acute onset abdominal pain and vomiting.  Vital signs stable, afebrile.  No significant abnormalities on CBC and CMP, lipase normal, troponin negative.  UA with negative nitrite, trace leukocytes, 6-10 WBCs, no bacteria.  EKG without acute ischemic changes.  Chest x-ray showing no active cardiopulmonary disease.  CT abdomen pelvis showing disproportionate dilation of multiple middle small bowel loops without discrete transition point.  Findings felt to be due to early partial SBO versus focal adynamic ileus.  No evidence of bowel ischemia or perforation seen.  ED physician spoke to general surgery PA Carl Best, they will see the patient in consultation.  Patient refused NG tube as he was not actively vomiting in the ED.  Medications administered in the ED include Zofran, IV Protonix 40 mg, 1 L normal saline, and nicotine patch.  Patient transferred to [facility] this evening.  Patient states yesterday night he suddenly started having severe generalized abdominal pain and started vomiting.  He had multiple episodes of vomiting at home and was not able to tolerate p.o. intake.  He reports improvement of his symptoms now.  Abdominal pain has improved and he has not vomited since he has been in the ED as such he prefers not having an NG tube placed at this time.  He is having regular bowel movements and last one was yesterday morning.  He denies history of previous abdominal surgeries or history of bowel obstruction.  He is endorsing acid reflux.  Denies history of any other medical problems.  He smokes 1 pack of cigarettes daily.  Review of Systems:  Review of Systems  All other systems reviewed  and are negative.   Past Medical History:  Diagnosis Date   Heart murmur     Past Surgical History:  Procedure Laterality Date   KNEE SURGERY Right 2010   KNEE SURGERY       reports that he has been smoking cigarettes. He has never used smokeless tobacco. He reports current alcohol use. He reports current drug use. Drug: Marijuana.  No Known Allergies  History reviewed. No pertinent family history.  Prior to Admission medications   Medication Sig Start Date End Date Taking? Authorizing Provider  cyclobenzaprine (FLEXERIL) 10 MG tablet Take 1 tablet (10 mg total) by mouth 2 (two) times daily as needed for muscle spasms. 08/23/22   Pollyann Savoy, MD  famotidine (PEPCID) 20 MG tablet Take 1 tablet (20 mg total) by mouth 2 (two) times daily. 12/05/22   Renne Crigler, PA-C  naproxen (NAPROSYN) 500 MG tablet Take 1 tablet (500 mg total) by mouth 2 (two) times daily. 08/23/22   Pollyann Savoy, MD  ondansetron (ZOFRAN-ODT) 4 MG disintegrating tablet Take 1 tablet (4 mg total) by mouth every 8 (eight)  hours as needed for nausea or vomiting. 12/05/22   Renne Crigler, PA-C  pantoprazole (PROTONIX) 20 MG tablet Take 1 tablet (20 mg total) by mouth daily. 12/05/22   Renne Crigler, PA-C    Physical Exam: Vitals:   05/07/23 1414 05/07/23 1516 05/07/23 1517 05/07/23 1722  BP:  127/82 127/82 130/76  Pulse:   67 67  Resp:   16 16  Temp: 98.4 F (36.9 C)   (!)  97.5 F (36.4 C)  TempSrc: Oral   Oral  SpO2:   100% 98%  Weight:      Height:        Physical Exam Vitals reviewed.  Constitutional:      General: He is not in acute distress. HENT:     Head: Normocephalic and atraumatic.  Eyes:     Extraocular Movements: Extraocular movements intact.  Cardiovascular:     Rate and Rhythm: Normal rate and regular rhythm.     Pulses: Normal pulses.  Pulmonary:     Effort: Pulmonary effort is normal. No respiratory distress.     Breath sounds: Normal breath sounds. No wheezing or  rales.  Abdominal:     General: Bowel sounds are normal. There is no distension.     Palpations: Abdomen is soft.     Tenderness: There is no abdominal tenderness. There is no guarding or rebound.  Musculoskeletal:     Cervical back: Normal range of motion.     Right lower leg: No edema.     Left lower leg: No edema.  Skin:    General: Skin is warm and dry.  Neurological:     General: No focal deficit present.     Mental Status: He is alert and oriented to person, place, and time.     Labs on Admission: I have personally reviewed following labs and imaging studies  CBC: Recent Labs  Lab 05/07/23 0329  WBC 7.5  HGB 13.9  HCT 41.4  MCV 96.5  PLT 227   Basic Metabolic Panel: Recent Labs  Lab 05/07/23 0329  NA 141  K 3.9  CL 103  CO2 32  GLUCOSE 98  BUN 11  CREATININE 0.99  CALCIUM 9.7   GFR: Estimated Creatinine Clearance: 95.1 mL/min (by C-G formula based on SCr of 0.99 mg/dL). Liver Function Tests: Recent Labs  Lab 05/07/23 0329  AST 10*  ALT 9  ALKPHOS 63  BILITOT 0.4  PROT 7.8  ALBUMIN 4.8   Recent Labs  Lab 05/07/23 0329  LIPASE 26   No results for input(s): "AMMONIA" in the last 168 hours. Coagulation Profile: No results for input(s): "INR", "PROTIME" in the last 168 hours. Cardiac Enzymes: No results for input(s): "CKTOTAL", "CKMB", "CKMBINDEX", "TROPONINI" in the last 168 hours. BNP (last 3 results) No results for input(s): "PROBNP" in the last 8760 hours. HbA1C: No results for input(s): "HGBA1C" in the last 72 hours. CBG: No results for input(s): "GLUCAP" in the last 168 hours. Lipid Profile: No results for input(s): "CHOL", "HDL", "LDLCALC", "TRIG", "CHOLHDL", "LDLDIRECT" in the last 72 hours. Thyroid Function Tests: No results for input(s): "TSH", "T4TOTAL", "FREET4", "T3FREE", "THYROIDAB" in the last 72 hours. Anemia Panel: No results for input(s): "VITAMINB12", "FOLATE", "FERRITIN", "TIBC", "IRON", "RETICCTPCT" in the last 72  hours. Urine analysis:    Component Value Date/Time   COLORURINE YELLOW 05/07/2023 0329   APPEARANCEUR CLEAR 05/07/2023 0329   LABSPEC 1.024 05/07/2023 0329   PHURINE 6.0 05/07/2023 0329   GLUCOSEU NEGATIVE 05/07/2023 0329   HGBUR NEGATIVE 05/07/2023 0329   BILIRUBINUR NEGATIVE 05/07/2023 0329   KETONESUR NEGATIVE 05/07/2023 0329   PROTEINUR TRACE (A) 05/07/2023 0329   NITRITE NEGATIVE 05/07/2023 0329   LEUKOCYTESUR TRACE (A) 05/07/2023 0329    Radiological Exams on Admission: CT ABDOMEN PELVIS W CONTRAST Result Date: 05/07/2023 CLINICAL DATA:  Abdominal pain, acute, nonlocalized. EXAM: CT ABDOMEN AND PELVIS WITH CONTRAST TECHNIQUE: Multidetector CT imaging of the abdomen and pelvis was performed using the  standard protocol following bolus administration of intravenous contrast. RADIATION DOSE REDUCTION: This exam was performed according to the departmental dose-optimization program which includes automated exposure control, adjustment of the mA and/or kV according to patient size and/or use of iterative reconstruction technique. CONTRAST:  OMNIPAQUE IOHEXOL 300 MG/ML  SOLN COMPARISON:  CT scan abdomen and pelvis from 12/05/2022. FINDINGS: Lower chest: The lung bases are clear. No pleural effusion. The heart is normal in size. No pericardial effusion. Hepatobiliary: The liver is normal in size. Non-cirrhotic configuration. No suspicious mass. No intrahepatic or extrahepatic bile duct dilation. There is a small volume dependent calcified gallstone without imaging signs of acute cholecystitis. Normal gallbladder wall thickness. No pericholecystic inflammatory changes. Pancreas: Unremarkable. No pancreatic ductal dilatation or surrounding inflammatory changes. Spleen: Within normal limits. No focal lesion. Adrenals/Urinary Tract: Unremarkable right adrenal gland. There is a stable 1.3 x 1.5 cm indeterminate left adrenal nodule, which cannot be characterized as an adenoma on the basis of this  exam. No suspicious renal mass. No hydronephrosis. No renal or ureteric calculi. Unremarkable urinary bladder. Stomach/Bowel: The stomach is distended with fluid and ingested food material. The duodenum and proximal jejunal loops are nondilated. However, there is dilation of multiple middle small bowel lobes with diameter measuring up to 3.3 cm. There is no discrete transition point. The dilated bowel loops do not exhibit abnormal wall thickening or perienteric fat stranding. No pneumatosis, portal venous gas or pneumoperitoneum. Unremarkable appendix. There are scattered colonic diverticula without diverticulitis. Distal small bowel loops and colon are nondilated. Vascular/Lymphatic: No ascites or pneumoperitoneum. No abdominal or pelvic lymphadenopathy, by size criteria. No aneurysmal dilation of the major abdominal arteries. Reproductive: Normal size prostate. Symmetric seminal vesicles. Other: There is a tiny fat containing umbilical hernia. The soft tissues and abdominal wall are otherwise unremarkable. Musculoskeletal: No suspicious osseous lesions. IMPRESSION: 1. There is disproportionate dilation of multiple middle small bowel loops without discrete transition point. Findings favor early partial small bowel obstruction versus focal adynamic ileus. No evidence of bowel ischemia or perforation. 2. Multiple other nonacute observations, as described above. Electronically Signed   By: Jules Schick M.D.   On: 05/07/2023 10:10   DG Chest 2 View Result Date: 05/07/2023 CLINICAL DATA:  Cough. EXAM: CHEST - 2 VIEW COMPARISON:  01/17/2022. FINDINGS: Bilateral lung fields are clear. Bilateral costophrenic angles are clear. Normal cardio-mediastinal silhouette. No acute osseous abnormalities. The soft tissues are within normal limits. IMPRESSION: No active cardiopulmonary disease. Electronically Signed   By: Jules Schick M.D.   On: 05/07/2023 09:43    EKG: Independently reviewed.  Sinus rhythm, no significant  change compared to previous EKG.  Assessment and Plan  Early partial SBO versus focal adynamic ileus No evidence of bowel ischemia or perforation seen on CT.  General surgery consulted.  Patient has refused NG tube.  He is not actively vomiting and abdominal pain has improved.  Keep n.p.o., IV fluid hydration, antiemetic as needed, and pain management.  Monitor electrolytes.  GERD IV Protonix 40 mg daily.  Tobacco abuse NicoDerm patch and counseled to quit.  DVT prophylaxis: SCDs Code Status: Full Code (discussed with the patient) Family Communication: No family available at this time. Consults called: General Surgery Level of care: Med-Surg Admission status: It is my clinical opinion that referral for OBSERVATION is reasonable and necessary in this patient based on the above information provided. The aforementioned taken together are felt to place the patient at high risk for further clinical deterioration. However, it is anticipated  that the patient may be medically stable for discharge from the hospital within 24 to 48 hours.  John Giovanni MD [facility]  If 7PM-7AM, please contact night-coverage [email or web address]  05/07/2023, 7:38 PM

## 2023-05-07 NOTE — ED Triage Notes (Signed)
 Pt ambulatory to triage with c/o generalized abdominal pain that started about 2 hours ago while playing pool. Vomited x 2. No diarrhea or fevers.

## 2023-05-07 NOTE — Progress Notes (Signed)
 Patient noted ambulating in hallway with security. When asked why did he leave the floor, he stated he just wanted to smoke. Explained the policy of this being a nonsmoking campus and nicoderm patch noted to arm that was placed on him prior to shift. Patient currently up in chair with his jacket and shoes on. Reminded patient that he is to have nothing to eat or drink by mouth. Patient noted to have gummy bears on the chair beside them. This nurse asked patient if he was planning on staying until in the morning so that [place] could treat his illness, he stated he will stay the night and abide by the rules. Will continue to observe and educate patient as needed.

## 2023-05-07 NOTE — Plan of Care (Signed)
 This is a 54 year old presented to ED via abdominal pain which started last night, associated with vomiting.  On arrival to ED, hemodynamically stable.  Further workup revealed Small bowel obstruction.  ED physician requested admission.  ED physician also spoke to general surgery PA Carl Best, no NG tube was recommended and they will see patient in consultation.  Admission accepted to MedSurg unit under [facility] hospitalist.  [facility] will assume care of the patient once patient arrives to the floor, until then, ED physician remains responsible for all patient care.

## 2023-05-07 NOTE — ED Notes (Signed)
 Patient's sister left phone number and is asking for a call by the ER provider.  Message sent to provider   [phone]

## 2023-05-07 NOTE — ED Provider Notes (Signed)
 [facility] EMERGENCY DEPARTMENT AT [facility] Provider Note   CSN: [id number] Arrival date & time: 05/07/23  6440     History  Chief Complaint  Patient presents with   Abdominal Pain    Luke Black is a 53 y.o. male.  Patient is a 53 year old man who presents with abdominal pain and vomiting.  He said it started during the night last night around midnight.  He had sudden onset of pain in his upper abdomen associated with nausea and vomiting.  He denies any recent alcohol use.  He denies any prior symptoms like this.  The pain did radiate up into his chest area and he has had some increase GERD symptoms.  No known fevers.  No urinary symptoms.  He denies any prior abdominal surgeries.       Home Medications Prior to Admission medications   Medication Sig Start Date End Date Taking? Authorizing Provider  cyclobenzaprine (FLEXERIL) 10 MG tablet Take 1 tablet (10 mg total) by mouth 2 (two) times daily as needed for muscle spasms. 08/23/22   Pollyann Savoy, MD  famotidine (PEPCID) 20 MG tablet Take 1 tablet (20 mg total) by mouth 2 (two) times daily. 12/05/22   Renne Crigler, PA-C  naproxen (NAPROSYN) 500 MG tablet Take 1 tablet (500 mg total) by mouth 2 (two) times daily. 08/23/22   Pollyann Savoy, MD  ondansetron (ZOFRAN-ODT) 4 MG disintegrating tablet Take 1 tablet (4 mg total) by mouth every 8 (eight) hours as needed for nausea or vomiting. 12/05/22   Renne Crigler, PA-C  pantoprazole (PROTONIX) 20 MG tablet Take 1 tablet (20 mg total) by mouth daily. 12/05/22   Renne Crigler, PA-C      Allergies    Patient has no known allergies.    Review of Systems   Review of Systems  Constitutional:  Negative for chills, diaphoresis, fatigue and fever.  HENT:  Negative for congestion, rhinorrhea and sneezing.   Eyes: Negative.   Respiratory:  Negative for cough, chest tightness and shortness of breath.   Cardiovascular:  Negative for chest pain and leg swelling.   Gastrointestinal:  Positive for abdominal pain, nausea and vomiting. Negative for blood in stool and diarrhea.  Genitourinary:  Negative for difficulty urinating, flank pain, frequency and hematuria.  Musculoskeletal:  Negative for arthralgias and back pain.  Skin:  Negative for rash.  Neurological:  Negative for dizziness, speech difficulty, weakness, numbness and headaches.    Physical Exam Updated Vital Signs BP 135/82   Pulse 81   Temp 98.2 F (36.8 C)   Resp 16   Ht 5\' 11"  (1.803 m)   Wt 77.1 kg   SpO2 98%   BMI 23.71 kg/m  Physical Exam Constitutional:      Appearance: He is well-developed.  HENT:     Head: Normocephalic and atraumatic.  Eyes:     Pupils: Pupils are equal, round, and reactive to light.  Cardiovascular:     Rate and Rhythm: Normal rate and regular rhythm.     Heart sounds: Normal heart sounds.  Pulmonary:     Effort: Pulmonary effort is normal. No respiratory distress.     Breath sounds: Normal breath sounds. No wheezing or rales.  Chest:     Chest wall: No tenderness.  Abdominal:     General: Bowel sounds are normal.     Palpations: Abdomen is soft.     Tenderness: There is abdominal tenderness in the right upper quadrant, epigastric area and left upper  quadrant. There is no guarding or rebound.  Musculoskeletal:        General: Normal range of motion.     Cervical back: Normal range of motion and neck supple.  Lymphadenopathy:     Cervical: No cervical adenopathy.  Skin:    General: Skin is warm and dry.     Findings: No rash.  Neurological:     Mental Status: He is alert and oriented to person, place, and time.     ED Results / Procedures / Treatments   Labs (all labs ordered are listed, but only abnormal results are displayed) Labs Reviewed  COMPREHENSIVE METABOLIC PANEL - Abnormal; Notable for the following components:      Result Value   AST 10 (*)    All other components within normal limits  URINALYSIS, ROUTINE W REFLEX  MICROSCOPIC - Abnormal; Notable for the following components:   Protein, ur TRACE (*)    Leukocytes,Ua TRACE (*)    All other components within normal limits  LIPASE, BLOOD  CBC  TROPONIN I (HIGH SENSITIVITY)    EKG EKG Interpretation Date/Time:  Friday May 07 2023 08:40:12 EST Ventricular Rate:  74 PR Interval:  168 QRS Duration:  100 QT Interval:  371 QTC Calculation: 412 R Axis:   75  Text Interpretation: Sinus rhythm Minimal ST elevation, anterior leads since last tracing no significant change Confirmed by Rolan Bucco [phone]) on 05/07/2023 9:11:53 AM  Radiology CT ABDOMEN PELVIS W CONTRAST Result Date: 05/07/2023 CLINICAL DATA:  Abdominal pain, acute, nonlocalized. EXAM: CT ABDOMEN AND PELVIS WITH CONTRAST TECHNIQUE: Multidetector CT imaging of the abdomen and pelvis was performed using the standard protocol following bolus administration of intravenous contrast. RADIATION DOSE REDUCTION: This exam was performed according to the departmental dose-optimization program which includes automated exposure control, adjustment of the mA and/or kV according to patient size and/or use of iterative reconstruction technique. CONTRAST:  OMNIPAQUE IOHEXOL 300 MG/ML  SOLN COMPARISON:  CT scan abdomen and pelvis from 12/05/2022. FINDINGS: Lower chest: The lung bases are clear. No pleural effusion. The heart is normal in size. No pericardial effusion. Hepatobiliary: The liver is normal in size. Non-cirrhotic configuration. No suspicious mass. No intrahepatic or extrahepatic bile duct dilation. There is a small volume dependent calcified gallstone without imaging signs of acute cholecystitis. Normal gallbladder wall thickness. No pericholecystic inflammatory changes. Pancreas: Unremarkable. No pancreatic ductal dilatation or surrounding inflammatory changes. Spleen: Within normal limits. No focal lesion. Adrenals/Urinary Tract: Unremarkable right adrenal gland. There is a stable 1.3 x 1.5 cm  indeterminate left adrenal nodule, which cannot be characterized as an adenoma on the basis of this exam. No suspicious renal mass. No hydronephrosis. No renal or ureteric calculi. Unremarkable urinary bladder. Stomach/Bowel: The stomach is distended with fluid and ingested food material. The duodenum and proximal jejunal loops are nondilated. However, there is dilation of multiple middle small bowel lobes with diameter measuring up to 3.3 cm. There is no discrete transition point. The dilated bowel loops do not exhibit abnormal wall thickening or perienteric fat stranding. No pneumatosis, portal venous gas or pneumoperitoneum. Unremarkable appendix. There are scattered colonic diverticula without diverticulitis. Distal small bowel loops and colon are nondilated. Vascular/Lymphatic: No ascites or pneumoperitoneum. No abdominal or pelvic lymphadenopathy, by size criteria. No aneurysmal dilation of the major abdominal arteries. Reproductive: Normal size prostate. Symmetric seminal vesicles. Other: There is a tiny fat containing umbilical hernia. The soft tissues and abdominal wall are otherwise unremarkable. Musculoskeletal: No suspicious osseous lesions. IMPRESSION: 1.  There is disproportionate dilation of multiple middle small bowel loops without discrete transition point. Findings favor early partial small bowel obstruction versus focal adynamic ileus. No evidence of bowel ischemia or perforation. 2. Multiple other nonacute observations, as described above. Electronically Signed   By: Jules Schick M.D.   On: 05/07/2023 10:10   DG Chest 2 View Result Date: 05/07/2023 CLINICAL DATA:  Cough. EXAM: CHEST - 2 VIEW COMPARISON:  01/17/2022. FINDINGS: Bilateral lung fields are clear. Bilateral costophrenic angles are clear. Normal cardio-mediastinal silhouette. No acute osseous abnormalities. The soft tissues are within normal limits. IMPRESSION: No active cardiopulmonary disease. Electronically Signed   By: Jules Schick M.D.   On: 05/07/2023 09:43    Procedures Procedures    Medications Ordered in ED Medications  pantoprazole (PROTONIX) injection 40 mg (40 mg Intravenous Given 05/07/23 0838)  ondansetron (ZOFRAN) injection 4 mg (4 mg Intravenous Given 05/07/23 0838)  sodium chloride 0.9 % bolus 1,000 mL (1,000 mLs Intravenous New Bag/Given 05/07/23 0842)  iohexol (OMNIPAQUE) 300 MG/ML solution 100 mL (100 mLs Intravenous Contrast Given 05/07/23 0850)    ED Course/ Medical Decision Making/ A&P                                 Medical Decision Making Amount and/or Complexity of Data Reviewed Labs: ordered. Radiology: ordered.  Risk Prescription drug management. Decision regarding hospitalization.   Patient is a 53 year old who presents with abdominal pain and vomiting.  Labs are reassuring.  There is no evidence of pancreatitis.  EKG does not show any concerns for ACS.  Troponin is negative.  Chest x-ray two-view does not show any acute abnormality.  This was interpreted by me and confirmed by the radiologist.  CT scan shows evidence of a partial small bowel obstruction.  I discussed the findings with Carl Best, PA, with general surgery.  The surgery team will see the patient when she is admitted.  I also spoke with Dr. Jacqulyn Bath who will admit the patient.  Initially I was going to place an NG tube but patient actually says he is feeling little bit better and we will hold off on this for now.  If he starts vomiting again, we will reconsider the NG tube.  Final Clinical Impression(s) / ED Diagnoses Final diagnoses:  Partial small bowel obstruction [facility])    Rx / DC Orders ED Discharge Orders     None         Rolan Bucco, MD 05/07/23 1230

## 2023-05-08 ENCOUNTER — Observation Stay (HOSPITAL_COMMUNITY)

## 2023-05-08 DIAGNOSIS — K56609 Unspecified intestinal obstruction, unspecified as to partial versus complete obstruction: Secondary | ICD-10-CM | POA: Diagnosis not present

## 2023-05-08 LAB — BASIC METABOLIC PANEL
Anion gap: 5 (ref 5–15)
BUN: 13 mg/dL (ref 6–20)
CO2: 25 mmol/L (ref 22–32)
Calcium: 8.4 mg/dL — ABNORMAL LOW (ref 8.9–10.3)
Chloride: 109 mmol/L (ref 98–111)
Creatinine, Ser: 0.94 mg/dL (ref 0.61–1.24)
GFR, Estimated: >60 mL/min (ref >60)
Glucose, Bld: 96 mg/dL (ref 70–99)
Potassium: 3.4 mmol/L — ABNORMAL LOW (ref 3.5–5.1)
Sodium: 139 mmol/L (ref 135–145)

## 2023-05-08 LAB — HIV ANTIBODY (ROUTINE TESTING W REFLEX): HIV Screen 4th Generation wRfx: NONREACTIVE

## 2023-05-08 MED ORDER — NICOTINE 21 MG/24HR TD PT24: 21.0000 mg | MEDICATED_PATCH | Freq: Once | TRANSDERMAL | 0 refills | Status: AC

## 2023-05-08 NOTE — Consult Note (Signed)
 CC: abd pain  Requesting provider: Dr Elvera Lennox  HPI: Luke Black is an 53 y.o. male who is here for 2 month h/o intermittent abd pain that worsened severely last night.  Labs were WNL's.  CT showed dilated small bowel concerning for obstruction.  Pt states he is passing flatus and having BM's.  Past Medical History:  Diagnosis Date   Heart murmur     Past Surgical History:  Procedure Laterality Date   KNEE SURGERY Right 2010   KNEE SURGERY      History reviewed. No pertinent family history.  Social:  reports that he has been smoking cigarettes. He has never used smokeless tobacco. He reports current alcohol use. He reports current drug use. Drug: Marijuana.  Allergies: No Known Allergies  Medications: I have reviewed the patient's current medications.  Results for orders placed or performed during the hospital encounter of 05/07/23 (from the past 48 hours)  Lipase, blood     Status: None   Collection Time: 05/07/23  3:29 AM  Result Value Ref Range   Lipase 26 11 - 51 U/L    Comment: Performed at [facility], [address] [place], [place] [address]  Comprehensive metabolic panel     Status: Abnormal   Collection Time: 05/07/23  3:29 AM  Result Value Ref Range   Sodium 141 135 - 145 mmol/L   Potassium 3.9 3.5 - 5.1 mmol/L   Chloride 103 98 - 111 mmol/L   CO2 32 22 - 32 mmol/L   Glucose, Bld 98 70 - 99 mg/dL    Comment: Glucose reference range applies only to samples taken after fasting for at least 8 hours.   BUN 11 6 - 20 mg/dL   Creatinine, Ser 6.96 0.61 - 1.24 mg/dL   Calcium 9.7 8.9 - 29.5 mg/dL   Total Protein 7.8 6.5 - 8.1 g/dL   Albumin 4.8 3.5 - 5.0 g/dL   AST 10 (L) 15 - 41 U/L   ALT 9 0 - 44 U/L   Alkaline Phosphatase 63 38 - 126 U/L   Total Bilirubin 0.4 0.0 - 1.2 mg/dL   GFR, Estimated >28 >41 mL/min    Comment: (NOTE) Calculated using the CKD-EPI Creatinine Equation (2021)    Anion gap 6 5 - 15    Comment: Performed at  [facility], [address] [place], [place] [address]  CBC     Status: None   Collection Time: 05/07/23  3:29 AM  Result Value Ref Range   WBC 7.5 4.0 - 10.5 K/uL   RBC 4.29 4.22 - 5.81 MIL/uL   Hemoglobin 13.9 13.0 - 17.0 g/dL   HCT 10.2 72.5 - 36.6 %   MCV 96.5 80.0 - 100.0 fL   MCH 32.4 26.0 - 34.0 pg   MCHC 33.6 30.0 - 36.0 g/dL   RDW 44.0 34.7 - 42.5 %   Platelets 227 150 - 400 K/uL   nRBC 0.0 0.0 - 0.2 %    Comment: Performed at [facility], [address], [place], [place] [address]  Urinalysis, Routine w reflex microscopic -Urine, Clean Catch     Status: Abnormal   Collection Time: 05/07/23  3:29 AM  Result Value Ref Range   Color, Urine YELLOW YELLOW   APPearance CLEAR CLEAR   Specific Gravity, Urine 1.024 1.005 - 1.030   pH 6.0 5.0 - 8.0   Glucose, UA NEGATIVE NEGATIVE mg/dL   Hgb urine dipstick NEGATIVE NEGATIVE   Bilirubin  Urine NEGATIVE NEGATIVE   Ketones, ur NEGATIVE NEGATIVE mg/dL   Protein, ur TRACE (A) NEGATIVE mg/dL   Nitrite NEGATIVE NEGATIVE   Leukocytes,Ua TRACE (A) NEGATIVE   RBC / HPF 0-5 0 - 5 RBC/hpf   WBC, UA 6-10 0 - 5 WBC/hpf   Bacteria, UA NONE SEEN NONE SEEN   Squamous Epithelial / HPF 0-5 0 - 5 /HPF   Mucus PRESENT     Comment: Performed at [facility], [address], [place], [place] [address]  Troponin I (High Sensitivity)     Status: None   Collection Time: 05/07/23  8:35 AM  Result Value Ref Range   Troponin I (High Sensitivity) 2 <18 ng/L    Comment: (NOTE) Elevated high sensitivity troponin I (hsTnI) values and significant  changes across serial measurements may suggest ACS but many other  chronic and acute conditions are known to elevate hsTnI results.  Refer to the "Links" section for chest pain algorithms and additional  guidance. Performed at [facility], [address], [place], [place] [address]   Basic metabolic panel     Status: Abnormal    Collection Time: 05/08/23  5:36 AM  Result Value Ref Range   Sodium 139 135 - 145 mmol/L   Potassium 3.4 (L) 3.5 - 5.1 mmol/L   Chloride 109 98 - 111 mmol/L   CO2 25 22 - 32 mmol/L   Glucose, Bld 96 70 - 99 mg/dL    Comment: Glucose reference range applies only to samples taken after fasting for at least 8 hours.   BUN 13 6 - 20 mg/dL   Creatinine, Ser 2.37 0.61 - 1.24 mg/dL   Calcium 8.4 (L) 8.9 - 10.3 mg/dL   GFR, Estimated >62 >83 mL/min    Comment: (NOTE) Calculated using the CKD-EPI Creatinine Equation (2021)    Anion gap 5 5 - 15    Comment: Performed at [facility], [address], [place], [place] [address]    CT ABDOMEN PELVIS W CONTRAST Result Date: 05/07/2023 CLINICAL DATA:  Abdominal pain, acute, nonlocalized. EXAM: CT ABDOMEN AND PELVIS WITH CONTRAST TECHNIQUE: Multidetector CT imaging of the abdomen and pelvis was performed using the standard protocol following bolus administration of intravenous contrast. RADIATION DOSE REDUCTION: This exam was performed according to the departmental dose-optimization program which includes automated exposure control, adjustment of the mA and/or kV according to patient size and/or use of iterative reconstruction technique. CONTRAST:  OMNIPAQUE IOHEXOL 300 MG/ML  SOLN COMPARISON:  CT scan abdomen and pelvis from 12/05/2022. FINDINGS: Lower chest: The lung bases are clear. No pleural effusion. The heart is normal in size. No pericardial effusion. Hepatobiliary: The liver is normal in size. Non-cirrhotic configuration. No suspicious mass. No intrahepatic or extrahepatic bile duct dilation. There is a small volume dependent calcified gallstone without imaging signs of acute cholecystitis. Normal gallbladder wall thickness. No pericholecystic inflammatory changes. Pancreas: Unremarkable. No pancreatic ductal dilatation or surrounding inflammatory changes. Spleen: Within normal limits. No focal lesion. Adrenals/Urinary Tract:  Unremarkable right adrenal gland. There is a stable 1.3 x 1.5 cm indeterminate left adrenal nodule, which cannot be characterized as an adenoma on the basis of this exam. No suspicious renal mass. No hydronephrosis. No renal or ureteric calculi. Unremarkable urinary bladder. Stomach/Bowel: The stomach is distended with fluid and ingested food material. The duodenum and proximal jejunal loops are nondilated. However, there is dilation of multiple middle small bowel lobes with diameter measuring up to 3.3 cm. There is  no discrete transition point. The dilated bowel loops do not exhibit abnormal wall thickening or perienteric fat stranding. No pneumatosis, portal venous gas or pneumoperitoneum. Unremarkable appendix. There are scattered colonic diverticula without diverticulitis. Distal small bowel loops and colon are nondilated. Vascular/Lymphatic: No ascites or pneumoperitoneum. No abdominal or pelvic lymphadenopathy, by size criteria. No aneurysmal dilation of the major abdominal arteries. Reproductive: Normal size prostate. Symmetric seminal vesicles. Other: There is a tiny fat containing umbilical hernia. The soft tissues and abdominal wall are otherwise unremarkable. Musculoskeletal: No suspicious osseous lesions. IMPRESSION: 1. There is disproportionate dilation of multiple middle small bowel loops without discrete transition point. Findings favor early partial small bowel obstruction versus focal adynamic ileus. No evidence of bowel ischemia or perforation. 2. Multiple other nonacute observations, as described above. Electronically Signed   By: Jules Schick M.D.   On: 05/07/2023 10:10   DG Chest 2 View Result Date: 05/07/2023 CLINICAL DATA:  Cough. EXAM: CHEST - 2 VIEW COMPARISON:  01/17/2022. FINDINGS: Bilateral lung fields are clear. Bilateral costophrenic angles are clear. Normal cardio-mediastinal silhouette. No acute osseous abnormalities. The soft tissues are within normal limits. IMPRESSION: No  active cardiopulmonary disease. Electronically Signed   By: Jules Schick M.D.   On: 05/07/2023 09:43    ROS - all of the below systems have been reviewed with the patient and positives are indicated with bold text General: chills, fever or night sweats Eyes: blurry vision or double vision ENT: epistaxis or sore throat Hematologic/Lymphatic: bleeding problems, blood clots or swollen lymph nodes Endocrine: temperature intolerance or unexpected weight changes Breast: new or changing breast lumps or nipple discharge Resp: cough, shortness of breath, or wheezing CV: chest pain or dyspnea on exertion GI: as per HPI GU: dysuria, trouble voiding, or hematuria Neuro: TIA or stroke symptoms    PE Blood pressure 125/73, pulse 73, temperature 98.2 F (36.8 C), temperature source Oral, resp. rate 18, height 5\' 11"  (1.803 m), weight 81.4 kg, SpO2 98%. Constitutional: NAD; conversant; no deformities Eyes: Moist conjunctiva; no lid lag; anicteric; PERRL Neck: Trachea midline; no thyromegaly Lungs: Normal respiratory effort CV: RRR GI: Abd soft, mildly distended MSK: Normal range of motion of extremities; no clubbing/cyanosis Psychiatric: Appropriate affect; alert and oriented x3  Results for orders placed or performed during the hospital encounter of 05/07/23 (from the past 48 hours)  Lipase, blood     Status: None   Collection Time: 05/07/23  3:29 AM  Result Value Ref Range   Lipase 26 11 - 51 U/L    Comment: Performed at [facility], [address] [place], [place] [address]  Comprehensive metabolic panel     Status: Abnormal   Collection Time: 05/07/23  3:29 AM  Result Value Ref Range   Sodium 141 135 - 145 mmol/L   Potassium 3.9 3.5 - 5.1 mmol/L   Chloride 103 98 - 111 mmol/L   CO2 32 22 - 32 mmol/L   Glucose, Bld 98 70 - 99 mg/dL    Comment: Glucose reference range applies only to samples taken after fasting for at least 8 hours.   BUN 11 6 - 20 mg/dL    Creatinine, Ser 7.67 0.61 - 1.24 mg/dL   Calcium 9.7 8.9 - 34.1 mg/dL   Total Protein 7.8 6.5 - 8.1 g/dL   Albumin 4.8 3.5 - 5.0 g/dL   AST 10 (L) 15 - 41 U/L   ALT 9 0 - 44 U/L   Alkaline Phosphatase 63 38 - 126 U/L  Total Bilirubin 0.4 0.0 - 1.2 mg/dL   GFR, Estimated >62 >95 mL/min    Comment: (NOTE) Calculated using the CKD-EPI Creatinine Equation (2021)    Anion gap 6 5 - 15    Comment: Performed at [facility], [address] [place], [place] [address]  CBC     Status: None   Collection Time: 05/07/23  3:29 AM  Result Value Ref Range   WBC 7.5 4.0 - 10.5 K/uL   RBC 4.29 4.22 - 5.81 MIL/uL   Hemoglobin 13.9 13.0 - 17.0 g/dL   HCT 24.4 01.0 - 27.2 %   MCV 96.5 80.0 - 100.0 fL   MCH 32.4 26.0 - 34.0 pg   MCHC 33.6 30.0 - 36.0 g/dL   RDW 53.6 64.4 - 03.4 %   Platelets 227 150 - 400 K/uL   nRBC 0.0 0.0 - 0.2 %    Comment: Performed at [facility], [address], [place], [place] [address]  Urinalysis, Routine w reflex microscopic -Urine, Clean Catch     Status: Abnormal   Collection Time: 05/07/23  3:29 AM  Result Value Ref Range   Color, Urine YELLOW YELLOW   APPearance CLEAR CLEAR   Specific Gravity, Urine 1.024 1.005 - 1.030   pH 6.0 5.0 - 8.0   Glucose, UA NEGATIVE NEGATIVE mg/dL   Hgb urine dipstick NEGATIVE NEGATIVE   Bilirubin Urine NEGATIVE NEGATIVE   Ketones, ur NEGATIVE NEGATIVE mg/dL   Protein, ur TRACE (A) NEGATIVE mg/dL   Nitrite NEGATIVE NEGATIVE   Leukocytes,Ua TRACE (A) NEGATIVE   RBC / HPF 0-5 0 - 5 RBC/hpf   WBC, UA 6-10 0 - 5 WBC/hpf   Bacteria, UA NONE SEEN NONE SEEN   Squamous Epithelial / HPF 0-5 0 - 5 /HPF   Mucus PRESENT     Comment: Performed at [facility], [address], [place], [place] [address]  Troponin I (High Sensitivity)     Status: None   Collection Time: 05/07/23  8:35 AM  Result Value Ref Range   Troponin I (High Sensitivity) 2 <18 ng/L    Comment:  (NOTE) Elevated high sensitivity troponin I (hsTnI) values and significant  changes across serial measurements may suggest ACS but many other  chronic and acute conditions are known to elevate hsTnI results.  Refer to the "Links" section for chest pain algorithms and additional  guidance. Performed at [facility], [address] [place], [place] [address]   Basic metabolic panel     Status: Abnormal   Collection Time: 05/08/23  5:36 AM  Result Value Ref Range   Sodium 139 135 - 145 mmol/L   Potassium 3.4 (L) 3.5 - 5.1 mmol/L   Chloride 109 98 - 111 mmol/L   CO2 25 22 - 32 mmol/L   Glucose, Bld 96 70 - 99 mg/dL    Comment: Glucose reference range applies only to samples taken after fasting for at least 8 hours.   BUN 13 6 - 20 mg/dL   Creatinine, Ser 2.95 0.61 - 1.24 mg/dL   Calcium 8.4 (L) 8.9 - 10.3 mg/dL   GFR, Estimated >18 >84 mL/min    Comment: (NOTE) Calculated using the CKD-EPI Creatinine Equation (2021)    Anion gap 5 5 - 15    Comment: Performed at [facility], [address], [place], [place] [address]    CT ABDOMEN PELVIS W CONTRAST Result Date: 05/07/2023 CLINICAL DATA:  Abdominal pain, acute, nonlocalized. EXAM: CT  ABDOMEN AND PELVIS WITH CONTRAST TECHNIQUE: Multidetector CT imaging of the abdomen and pelvis was performed using the standard protocol following bolus administration of intravenous contrast. RADIATION DOSE REDUCTION: This exam was performed according to the departmental dose-optimization program which includes automated exposure control, adjustment of the mA and/or kV according to patient size and/or use of iterative reconstruction technique. CONTRAST:  OMNIPAQUE IOHEXOL 300 MG/ML  SOLN COMPARISON:  CT scan abdomen and pelvis from 12/05/2022. FINDINGS: Lower chest: The lung bases are clear. No pleural effusion. The heart is normal in size. No pericardial effusion. Hepatobiliary: The liver is normal in size.  Non-cirrhotic configuration. No suspicious mass. No intrahepatic or extrahepatic bile duct dilation. There is a small volume dependent calcified gallstone without imaging signs of acute cholecystitis. Normal gallbladder wall thickness. No pericholecystic inflammatory changes. Pancreas: Unremarkable. No pancreatic ductal dilatation or surrounding inflammatory changes. Spleen: Within normal limits. No focal lesion. Adrenals/Urinary Tract: Unremarkable right adrenal gland. There is a stable 1.3 x 1.5 cm indeterminate left adrenal nodule, which cannot be characterized as an adenoma on the basis of this exam. No suspicious renal mass. No hydronephrosis. No renal or ureteric calculi. Unremarkable urinary bladder. Stomach/Bowel: The stomach is distended with fluid and ingested food material. The duodenum and proximal jejunal loops are nondilated. However, there is dilation of multiple middle small bowel lobes with diameter measuring up to 3.3 cm. There is no discrete transition point. The dilated bowel loops do not exhibit abnormal wall thickening or perienteric fat stranding. No pneumatosis, portal venous gas or pneumoperitoneum. Unremarkable appendix. There are scattered colonic diverticula without diverticulitis. Distal small bowel loops and colon are nondilated. Vascular/Lymphatic: No ascites or pneumoperitoneum. No abdominal or pelvic lymphadenopathy, by size criteria. No aneurysmal dilation of the major abdominal arteries. Reproductive: Normal size prostate. Symmetric seminal vesicles. Other: There is a tiny fat containing umbilical hernia. The soft tissues and abdominal wall are otherwise unremarkable. Musculoskeletal: No suspicious osseous lesions. IMPRESSION: 1. There is disproportionate dilation of multiple middle small bowel loops without discrete transition point. Findings favor early partial small bowel obstruction versus focal adynamic ileus. No evidence of bowel ischemia or perforation. 2. Multiple other  nonacute observations, as described above. Electronically Signed   By: Jules Schick M.D.   On: 05/07/2023 10:10   DG Chest 2 View Result Date: 05/07/2023 CLINICAL DATA:  Cough. EXAM: CHEST - 2 VIEW COMPARISON:  01/17/2022. FINDINGS: Bilateral lung fields are clear. Bilateral costophrenic angles are clear. Normal cardio-mediastinal silhouette. No acute osseous abnormalities. The soft tissues are within normal limits. IMPRESSION: No active cardiopulmonary disease. Electronically Signed   By: Jules Schick M.D.   On: 05/07/2023 09:43     A/P: Luke Black is an 53 y.o. male with abd pain and dilated small bowel.  Pt with no signs of SBO clinically.  Will get AXR to eval progression.  If this does not seem worse, can advance diet as tolerated.  May have pSBO causing the intermittent pain.  If AXR is concerning, may benefit from enterography study.     Vanita Panda, MD  Colorectal and General Surgery [facility]  Total time of evaluation, examination, counseling and implementing medical decisions was 65 mins.

## 2023-05-08 NOTE — Progress Notes (Signed)
 Assessment unchanged. Pt verbalized understanding of dc instructions including medications to resume and follow up care. Discharge via foot to front entrance accompanied by NT.

## 2023-05-08 NOTE — Discharge Summary (Signed)
 Physician Discharge Summary  Luke Black JWJ:[id number] DOB: March 03, 1971 DOA: 05/07/2023  PCP: Pcp, No  Admit date: 05/07/2023 Discharge date: 05/08/2023  Admitted From: home Disposition:  home  Recommendations for Outpatient Follow-up:  Follow up with PCP in 1-2 weeks Follow up with gastroenterology in 2-3 weeks  Home Health: none Equipment/Devices: none  Discharge Condition: stable CODE STATUS: Full code  HPI: Per admitting MD, Luke Black is a 53 y.o. male with medical history significant of marijuana and tobacco abuse presented ED with complaints of acute onset abdominal pain and vomiting.  Vital signs stable, afebrile.  No significant abnormalities on CBC and CMP, lipase normal, troponin negative.  UA with negative nitrite, trace leukocytes, 6-10 WBCs, no bacteria.  EKG without acute ischemic changes.  Chest x-ray showing no active cardiopulmonary disease.  CT abdomen pelvis showing disproportionate dilation of multiple middle small bowel loops without discrete transition point.  Findings felt to be due to early partial SBO versus focal adynamic ileus.  No evidence of bowel ischemia or perforation seen.  ED physician spoke to general surgery PA Carl Best, they will see the patient in consultation.  Patient refused NG tube as he was not actively vomiting in the ED.  Medications administered in the ED include Zofran, IV Protonix 40 mg, 1 L normal saline, and nicotine patch.  Patient transferred to [facility] this evening. Patient states yesterday night he suddenly started having severe generalized abdominal pain and started vomiting.  He had multiple episodes of vomiting at home and was not able to tolerate p.o. intake.  He reports improvement of his symptoms now.  Abdominal pain has improved and he has not vomited since he has been in the ED as such he prefers not having an NG tube placed at this time.  He is having regular bowel movements and last one was yesterday  morning.  He denies history of previous abdominal surgeries or history of bowel obstruction.  He is endorsing acid reflux.  Denies history of any other medical problems.  He smokes 1 pack of cigarettes daily.  Hospital Course / Discharge diagnoses: Principal Problem:   SBO (small bowel obstruction) (HCC) Active Problems:   GERD (gastroesophageal reflux disease)   Tobacco abuse  Principal problem Early partial small bowel obstruction versus focal adynamic ileus - underwent a CT scan on admission without evidence of bowel ischemia or perforation.  General surgery consulted and evaluated patient, recommending conservative management for now.  Patient very insistent that he goes home and cannot stay in the hospital furthermore.  Fortunately, a repeat abdominal x-ray shows improvement, he was placed on a diet and tolerating, therefore will be discharged home in stable condition.  He has been having intermittent symptoms for the past 2 months, suggesting perhaps an additional process rather than a simple small bowel obstruction, especially given the fact that he has not had abdominal surgeries in the past.  He may benefit from her enterography study per general surgery, however given patient's wishes that he is discharged in current clinical stability, he was advised to follow-up as an outpatient with PCP, and also establish care with gastroenterology for further workup.  Active problems Tobacco use - counseled for cessation, nicotine patch prescribed on discharge  Sepsis ruled out   Discharge Instructions   Allergies as of 05/08/2023   No Known Allergies      Medication List     TAKE these medications    nicotine 21 mg/24hr patch Commonly known as: NICODERM CQ -  dosed in mg/24 hours Place 1 patch (21 mg total) onto the skin once for 1 dose.        Follow-up Information     [facility] Follow up in 2 week(s).   Specialty: Gastroenterology Contact  information: [address] [place] [address] [phone]                Consultations: General surgery   Procedures/Studies:  DG Abd Portable 1V Result Date: 05/08/2023 CLINICAL DATA:  Small-bowel obstruction. EXAM: PORTABLE ABDOMEN - 1 VIEW COMPARISON:  Abdominopelvic CT 05/07/2023 and 12/05/2022. FINDINGS: 0851 hours. Two supine views of the abdomen are submitted. The small bowel distension demonstrated on CT yesterday appears improved based on this limited supine examination. There is gas within the rectum. No supine evidence of pneumoperitoneum. No suspicious abdominal calcifications or acute osseous findings. IMPRESSION: Improved small bowel distension compared with CT yesterday. Electronically Signed   By: Carey Bullocks M.D.   On: 05/08/2023 11:47   CT ABDOMEN PELVIS W CONTRAST Result Date: 05/07/2023 CLINICAL DATA:  Abdominal pain, acute, nonlocalized. EXAM: CT ABDOMEN AND PELVIS WITH CONTRAST TECHNIQUE: Multidetector CT imaging of the abdomen and pelvis was performed using the standard protocol following bolus administration of intravenous contrast. RADIATION DOSE REDUCTION: This exam was performed according to the departmental dose-optimization program which includes automated exposure control, adjustment of the mA and/or kV according to patient size and/or use of iterative reconstruction technique. CONTRAST:  OMNIPAQUE IOHEXOL 300 MG/ML  SOLN COMPARISON:  CT scan abdomen and pelvis from 12/05/2022. FINDINGS: Lower chest: The lung bases are clear. No pleural effusion. The heart is normal in size. No pericardial effusion. Hepatobiliary: The liver is normal in size. Non-cirrhotic configuration. No suspicious mass. No intrahepatic or extrahepatic bile duct dilation. There is a small volume dependent calcified gallstone without imaging signs of acute cholecystitis. Normal gallbladder wall thickness. No pericholecystic inflammatory changes. Pancreas:  Unremarkable. No pancreatic ductal dilatation or surrounding inflammatory changes. Spleen: Within normal limits. No focal lesion. Adrenals/Urinary Tract: Unremarkable right adrenal gland. There is a stable 1.3 x 1.5 cm indeterminate left adrenal nodule, which cannot be characterized as an adenoma on the basis of this exam. No suspicious renal mass. No hydronephrosis. No renal or ureteric calculi. Unremarkable urinary bladder. Stomach/Bowel: The stomach is distended with fluid and ingested food material. The duodenum and proximal jejunal loops are nondilated. However, there is dilation of multiple middle small bowel lobes with diameter measuring up to 3.3 cm. There is no discrete transition point. The dilated bowel loops do not exhibit abnormal wall thickening or perienteric fat stranding. No pneumatosis, portal venous gas or pneumoperitoneum. Unremarkable appendix. There are scattered colonic diverticula without diverticulitis. Distal small bowel loops and colon are nondilated. Vascular/Lymphatic: No ascites or pneumoperitoneum. No abdominal or pelvic lymphadenopathy, by size criteria. No aneurysmal dilation of the major abdominal arteries. Reproductive: Normal size prostate. Symmetric seminal vesicles. Other: There is a tiny fat containing umbilical hernia. The soft tissues and abdominal wall are otherwise unremarkable. Musculoskeletal: No suspicious osseous lesions. IMPRESSION: 1. There is disproportionate dilation of multiple middle small bowel loops without discrete transition point. Findings favor early partial small bowel obstruction versus focal adynamic ileus. No evidence of bowel ischemia or perforation. 2. Multiple other nonacute observations, as described above. Electronically Signed   By: Jules Schick M.D.   On: 05/07/2023 10:10   DG Chest 2 View Result Date: 05/07/2023 CLINICAL DATA:  Cough. EXAM: CHEST - 2 VIEW COMPARISON:  01/17/2022.  FINDINGS: Bilateral lung fields are clear. Bilateral  costophrenic angles are clear. Normal cardio-mediastinal silhouette. No acute osseous abnormalities. The soft tissues are within normal limits. IMPRESSION: No active cardiopulmonary disease. Electronically Signed   By: Jules Schick M.D.   On: 05/07/2023 09:43    Subjective: - no chest pain, shortness of breath, no abdominal pain, nausea or vomiting.   Discharge Exam: BP 129/82 (BP Location: Right Arm)   Pulse 69   Temp 98.6 F (37 C) (Oral)   Resp 16   Ht 5\' 11"  (1.803 m)   Wt 81.4 kg   SpO2 100%   BMI 25.03 kg/m   General: Pt is alert, awake, not in acute distress Cardiovascular: RRR, S1/S2 +, no rubs, no gallops Respiratory: CTA bilaterally, no wheezing, no rhonchi Abdominal: Soft, NT, ND, bowel sounds + Extremities: no edema, no cyanosis    The results of significant diagnostics from this hospitalization (including imaging, microbiology, ancillary and laboratory) are listed below for reference.     Microbiology: No results found for this or any previous visit (from the past 240 hours).   Labs: Basic Metabolic Panel: Recent Labs  Lab 05/07/23 0329 05/08/23 0536  NA 141 139  K 3.9 3.4*  CL 103 109  CO2 32 25  GLUCOSE 98 96  BUN 11 13  CREATININE 0.99 0.94  CALCIUM 9.7 8.4*   Liver Function Tests: Recent Labs  Lab 05/07/23 0329  AST 10*  ALT 9  ALKPHOS 63  BILITOT 0.4  PROT 7.8  ALBUMIN 4.8   CBC: Recent Labs  Lab 05/07/23 0329  WBC 7.5  HGB 13.9  HCT 41.4  MCV 96.5  PLT 227   CBG: No results for input(s): "GLUCAP" in the last 168 hours. Hgb A1c No results for input(s): "HGBA1C" in the last 72 hours. Lipid Profile No results for input(s): "CHOL", "HDL", "LDLCALC", "TRIG", "CHOLHDL", "LDLDIRECT" in the last 72 hours. Thyroid function studies No results for input(s): "TSH", "T4TOTAL", "T3FREE", "THYROIDAB" in the last 72 hours.  Invalid input(s): "FREET3" Urinalysis    Component Value Date/Time   COLORURINE YELLOW 05/07/2023 0329    APPEARANCEUR CLEAR 05/07/2023 0329   LABSPEC 1.024 05/07/2023 0329   PHURINE 6.0 05/07/2023 0329   GLUCOSEU NEGATIVE 05/07/2023 0329   HGBUR NEGATIVE 05/07/2023 0329   BILIRUBINUR NEGATIVE 05/07/2023 0329   KETONESUR NEGATIVE 05/07/2023 0329   PROTEINUR TRACE (A) 05/07/2023 0329   NITRITE NEGATIVE 05/07/2023 0329   LEUKOCYTESUR TRACE (A) 05/07/2023 0329    FURTHER DISCHARGE INSTRUCTIONS:   Get Medicines reviewed and adjusted: Please take all your medications with you for your next visit with your Primary MD   Laboratory/radiological data: Please request your Primary MD to go over all hospital tests and procedure/radiological results at the follow up, please ask your Primary MD to get all Hospital records sent to his/her office.   In some cases, they will be blood work, cultures and biopsy results pending at the time of your discharge. Please request that your primary care M.D. goes through all the records of your hospital data and follows up on these results.   Also Note the following: If you experience worsening of your admission symptoms, develop shortness of breath, life threatening emergency, suicidal or homicidal thoughts you must seek medical attention immediately by calling 911 or calling your MD immediately  if symptoms less severe.   You must read complete instructions/literature along with all the possible adverse reactions/side effects for all the Medicines you take and that have  been prescribed to you. Take any new Medicines after you have completely understood and accpet all the possible adverse reactions/side effects.    Do not drive when taking Pain medications or sleeping medications (Benzodaizepines)   Do not take more than prescribed Pain, Sleep and Anxiety Medications. It is not advisable to combine anxiety,sleep and pain medications without talking with your primary care practitioner   Special Instructions: If you have smoked or chewed Tobacco  in the last 2 yrs  please stop smoking, stop any regular Alcohol  and or any Recreational drug use.   Wear Seat belts while driving.   Please note: You were cared for by a hospitalist during your hospital stay. Once you are discharged, your primary care physician will handle any further medical issues. Please note that NO REFILLS for any discharge medications will be authorized once you are discharged, as it is imperative that you return to your primary care physician (or establish a relationship with a primary care physician if you do not have one) for your post hospital discharge needs so that they can reassess your need for medications and monitor your lab values.  Time coordinating discharge: 35 minutes  SIGNED:  Pamella Pert, MD, PhD 05/08/2023, 1:39 PM

## 2023-05-08 NOTE — Plan of Care (Signed)

## 2023-05-08 NOTE — Discharge Instructions (Signed)
 Follow with PCP and gastroenterology within 1-2 weeks  Please get a complete blood count and chemistry panel checked by your Primary MD at your next visit, and again as instructed by your Primary MD. Please get your medications reviewed and adjusted by your Primary MD.  Please request your Primary MD to go over all Hospital Tests and Procedure/Radiological results at the follow up, please get all Hospital records sent to your Prim MD by signing hospital release before you go home.  In some cases, there will be blood work, cultures and biopsy results pending at the time of your discharge. Please request that your primary care M.D. goes through all the records of your hospital data and follows up on these results.  If you had Pneumonia of Lung problems at the Hospital: Please get a 2 view Chest X ray done in 6-8 weeks after hospital discharge or sooner if instructed by your Primary MD.  If you have Congestive Heart Failure: Please call your Cardiologist or Primary MD anytime you have any of the following symptoms:  1) 3 pound weight gain in 24 hours or 5 pounds in 1 week  2) shortness of breath, with or without a dry hacking cough  3) swelling in the hands, feet or stomach  4) if you have to sleep on extra pillows at night in order to breathe  Follow cardiac low salt diet and 1.5 lit/day fluid restriction.  If you have diabetes Accuchecks 4 times/day, Once in AM empty stomach and then before each meal. Log in all results and show them to your primary doctor at your next visit. If any glucose reading is under 80 or above 300 call your primary MD immediately.  If you have Seizure/Convulsions/Epilepsy: Please do not drive, operate heavy machinery, participate in activities at heights or participate in high speed sports until you have seen by Primary MD or a Neurologist and advised to do so again. Per [facility] statutes, patients with seizures are not allowed to drive until they have  been seizure-free for six months.  Use caution when using heavy equipment or power tools. Avoid working on ladders or at heights. Take showers instead of baths. Ensure the water temperature is not too high on the home water heater. Do not go swimming alone. Do not lock yourself in a room alone (i.e. bathroom). When caring for infants or small children, sit down when holding, feeding, or changing them to minimize risk of injury to the child in the event you have a seizure. Maintain good sleep hygiene. Avoid alcohol.   If you had Gastrointestinal Bleeding: Please ask your Primary MD to check a complete blood count within one week of discharge or at your next visit. Your endoscopic/colonoscopic biopsies that are pending at the time of discharge, will also need to followed by your Primary MD.  Get Medicines reviewed and adjusted. Please take all your medications with you for your next visit with your Primary MD  Please request your Primary MD to go over all hospital tests and procedure/radiological results at the follow up, please ask your Primary MD to get all Hospital records sent to his/her office.  If you experience worsening of your admission symptoms, develop shortness of breath, life threatening emergency, suicidal or homicidal thoughts you must seek medical attention immediately by calling 911 or calling your MD immediately  if symptoms less severe.  You must read complete instructions/literature along with all the possible adverse reactions/side effects for all the Medicines you take  and that have been prescribed to you. Take any new Medicines after you have completely understood and accpet all the possible adverse reactions/side effects.   Do not drive or operate heavy machinery when taking Pain medications.   Do not take more than prescribed Pain, Sleep and Anxiety Medications  Special Instructions: If you have smoked or chewed Tobacco  in the last 2 yrs please stop smoking, stop any  regular Alcohol  and or any Recreational drug use.  Wear Seat belts while driving.  Please note You were cared for by a hospitalist during your hospital stay. If you have any questions about your discharge medications or the care you received while you were in the hospital after you are discharged, you can call the unit and asked to speak with the hospitalist on call if the hospitalist that took care of you is not available. Once you are discharged, your primary care physician will handle any further medical issues. Please note that NO REFILLS for any discharge medications will be authorized once you are discharged, as it is imperative that you return to your primary care physician (or establish a relationship with a primary care physician if you do not have one) for your aftercare needs so that they can reassess your need for medications and monitor your lab values.  You can reach the hospitalist office at phone [phone] or fax [phone]   If you do not have a primary care physician, you can call [phone] for a physician referral.  Activity: As tolerated with Full fall precautions use walker/cane & assistance as needed    Diet: soft  Disposition Home

## 2023-05-10 ENCOUNTER — Encounter: Payer: Self-pay | Admitting: Gastroenterology

## 2023-06-28 ENCOUNTER — Encounter: Payer: Self-pay | Admitting: Gastroenterology

## 2023-06-28 ENCOUNTER — Ambulatory Visit (INDEPENDENT_AMBULATORY_CARE_PROVIDER_SITE_OTHER): Admitting: Gastroenterology

## 2023-06-28 VITALS — BP 120/80 | HR 77 | Ht 71.0 in | Wt 180.0 lb

## 2023-06-28 DIAGNOSIS — Z1211 Encounter for screening for malignant neoplasm of colon: Secondary | ICD-10-CM

## 2023-06-28 DIAGNOSIS — K56600 Partial intestinal obstruction, unspecified as to cause: Secondary | ICD-10-CM

## 2023-06-28 DIAGNOSIS — K56609 Unspecified intestinal obstruction, unspecified as to partial versus complete obstruction: Secondary | ICD-10-CM

## 2023-06-28 MED ORDER — SUTAB 1479-225-188 MG PO TABS: ORAL_TABLET | ORAL | 0 refills | Status: DC

## 2023-06-28 NOTE — Progress Notes (Signed)
 Chief Complaint: Hospital follow-up Primary GI MD: Para Bold  HPI: Discussed the use of AI scribe software for clinical note transcription with the patient, who gave verbal consent to proceed.  History of Present Illness He is a 53 year old male who presents for follow-up after a small bowel obstruction.  He was hospitalized on February 28th for an early small bowel obstruction, which was managed conservatively without the need for an NG tube. He subsequently improved and has not experienced any similar issues since the hospitalization. He has no prior history of bowel obstructions.  General surgery was consulted during admission  He initially presented with severe abdominal pain that led to vomiting, prompting him to visit the emergency room where the obstruction was diagnosed. No constipation was noted prior to the hospital visit, and he has not experienced any nausea, vomiting, or changes in bowel habits since then.  There is no known family history of colon cancer, although there is some history of other cancers in the family. He has never undergone a colonoscopy.   PREVIOUS GI WORKUP   CT abdomen pelvis with contrast 05/07/2023 - Disproportionate dilation of multiple middle small bowel loops without discrete transition point.  Favor early partial small bowel obstruction versus ileus. - Stable adrenal nodule - Calcified gallstone  Past Medical History:  Diagnosis Date   Heart murmur     Past Surgical History:  Procedure Laterality Date   KNEE SURGERY Right 2010   KNEE SURGERY      No current outpatient medications on file.   No current facility-administered medications for this visit.    Allergies as of 06/28/2023   (No Known Allergies)    Family History  Problem Relation Age of Onset   Lung cancer Father    Stomach cancer Other    Liver disease Neg Hx    Colon cancer Neg Hx    Esophageal cancer Neg Hx     Social History   Socioeconomic History   Marital  status: Single    Spouse name: Not on file   Number of children: 2   Years of education: Not on file   Highest education level: Not on file  Occupational History   Occupation: postal service  Tobacco Use   Smoking status: Every Day    Types: Cigarettes   Smokeless tobacco: Never  Vaping Use   Vaping status: Some Days  Substance and Sexual Activity   Alcohol use: Yes    Comment: occasional   Drug use: Yes    Types: Marijuana   Sexual activity: Not on file  Other Topics Concern   Not on file  Social History Narrative   Not on file   Social Drivers of Health   Financial Resource Strain: Not on file  Food Insecurity: No Food Insecurity (05/07/2023)   Hunger Vital Sign    Worried About Running Out of Food in the Last Year: Never true    Ran Out of Food in the Last Year: Never true  Transportation Needs: No Transportation Needs (05/07/2023)   PRAPARE - Administrator, Civil Service (Medical): No    Lack of Transportation (Non-Medical): No  Physical Activity: Not on file  Stress: Not on file  Social Connections: Unknown (09/21/2022)   Received from [facility]   Social Network    Social Network: Not on file  Intimate Partner Violence: Not At Risk (05/07/2023)   Humiliation, Afraid, Rape, and Kick questionnaire    Fear of Current or Ex-Partner: No  Emotionally Abused: No    Physically Abused: No    Sexually Abused: No    Review of Systems:    Constitutional: No weight loss, fever, chills, weakness or fatigue HEENT: Eyes: No change in vision               Ears, Nose, Throat:  No change in hearing or congestion Skin: No rash or itching Cardiovascular: No chest pain, chest pressure or palpitations   Respiratory: No SOB or cough Gastrointestinal: See HPI and otherwise negative Genitourinary: No dysuria or change in urinary frequency Neurological: No headache, dizziness or syncope Musculoskeletal: No new muscle or joint pain Hematologic: No bleeding or  bruising Psychiatric: No history of depression or anxiety    Physical Exam:  Vital signs: BP 120/80   Pulse 77   Ht 5\' 11"  (1.803 m)   Wt 180 lb (81.6 kg)   BMI 25.10 kg/m   Constitutional: NAD, alert and cooperative Head:  Normocephalic and atraumatic. Eyes:   PEERL, EOMI. No icterus. Conjunctiva pink. Respiratory: Respirations even and unlabored. Lungs clear to auscultation bilaterally.   No wheezes, crackles, or rhonchi.  Cardiovascular:  Regular rate and rhythm. No peripheral edema, cyanosis or pallor.  Gastrointestinal:  Soft, nondistended, nontender. No rebound or guarding. Normal bowel sounds. No appreciable masses or hepatomegaly. Rectal:  Declines Msk:  Symmetrical without gross deformities. Without edema, no deformity or joint abnormality.  Neurologic:  Alert and  oriented x4;  grossly normal neurologically.  Skin:   Dry and intact without significant lesions or rashes. Psychiatric: Oriented to person, place and time. Demonstrates good judgement and reason without abnormal affect or behaviors.  RELEVANT LABS AND IMAGING: CBC    Component Value Date/Time   WBC 7.5 05/07/2023 0329   RBC 4.29 05/07/2023 0329   HGB 13.9 05/07/2023 0329   HCT 41.4 05/07/2023 0329   PLT 227 05/07/2023 0329   MCV 96.5 05/07/2023 0329   MCH 32.4 05/07/2023 0329   MCHC 33.6 05/07/2023 0329   RDW 14.4 05/07/2023 0329   LYMPHSABS 1.3 06/01/2022 1840   MONOABS 0.6 06/01/2022 1840   EOSABS 0.1 06/01/2022 1840   BASOSABS 0.0 06/01/2022 1840    CMP     Component Value Date/Time   NA 139 05/08/2023 0536   K 3.4 (L) 05/08/2023 0536   CL 109 05/08/2023 0536   CO2 25 05/08/2023 0536   GLUCOSE 96 05/08/2023 0536   BUN 13 05/08/2023 0536   CREATININE 0.94 05/08/2023 0536   CALCIUM 8.4 (L) 05/08/2023 0536   PROT 7.8 05/07/2023 0329   ALBUMIN 4.8 05/07/2023 0329   AST 10 (L) 05/07/2023 0329   ALT 9 05/07/2023 0329   ALKPHOS 63 05/07/2023 0329   BILITOT 0.4 05/07/2023 0329   GFRNONAA  >60 05/08/2023 0536     Assessment/Plan:   Partial small bowel obstruction Hospitalized February 2025 with CT showing partial small bowel obstruction with general surgery consulting and able to manage conservatively without the need for NG tube with follow-up abdominal x-ray showing improvement and no further symptoms -Try eating smaller meals more often throughout the day.  -Chew your food very well. Try to chew each bite until it is liquid.  -Avoid high-fiber foods and raw fruits and vegetables. These may cause another blockage. -Drinking plenty of water may help. If you have kidney, heart, or liver disease and have to limit fluids, talk with your doctor before you increase the amount of fluids you drink. Your doctor may ask that you drink  high-calorie liquid formulas if your symptoms require them.  -Be on a low fiber/residue diet. You should check with your doctor before eating whole-grain products or using a fiber supplement such as Citrucel or Metamucil. -Try to get at least 30 minutes of physical activity on most days of the week. Walking is a good choice.  Screening for colon cancer No previous colon cancer screening.  No family history of colon cancer.  No GI issues at this time. - Schedule colonoscopy - I thoroughly discussed the procedure with the patient (at bedside) to include nature of the procedure, alternatives, benefits, and risks (including but not limited to bleeding, infection, perforation, anesthesia/cardiac pulmonary complications).  Patient verbalized understanding and gave verbal consent to proceed with procedure.   Assigned to Dr. Cherryl Corona based on scheduling.  Suzanna Erp, PA-C [facility] 06/28/2023, 9:57 AM  Cc: No ref. provider found

## 2023-06-28 NOTE — Patient Instructions (Signed)
 You have been scheduled for a Colonoscopy. Please follow written instructions given to you at your visit today.   If you use inhalers (even only as needed), please bring them with you on the day of your procedure.  DO NOT TAKE 7 DAYS PRIOR TO TEST- Trulicity (dulaglutide) Ozempic, Wegovy (semaglutide) Mounjaro (tirzepatide) Bydureon Bcise (exanatide extended release)  DO NOT TAKE 1 DAY PRIOR TO YOUR TEST Rybelsus (semaglutide) Adlyxin (lixisenatide) Victoza (liraglutide) Byetta (exanatide) ___________________________________________________________________________  Please follow up as needed if symptoms increase or worsen   Due to recent changes in healthcare laws, you may see the results of your imaging and laboratory studies on MyChart before your provider has had a chance to review them.  We understand that in some cases there may be results that are confusing or concerning to you. Not all laboratory results come back in the same time frame and the provider may be waiting for multiple results in order to interpret others.  Please give us  48 hours in order for your provider to thoroughly review all the results before contacting the office for clarification of your results.   _______________________________________________________  If your blood pressure at your visit was 140/90 or greater, please contact your primary care physician to follow up on this.  _______________________________________________________  If you are age 52 or older, your body mass index should be between 23-30. Your Body mass index is 25.1 kg/m. If this is out of the aforementioned range listed, please consider follow up with your Primary Care Provider.  If you are age 59 or younger, your body mass index should be between 19-25. Your Body mass index is 25.1 kg/m. If this is out of the aformentioned range listed, please consider follow up with your Primary Care Provider.    ________________________________________________________  The [facility] providers would like to encourage you to use MYCHART to communicate with providers for non-urgent requests or questions.  Due to long hold times on the telephone, sending your provider a message by [facility] may be a faster and more efficient way to get a response.  Please allow 48 business hours for a response.  Please remember that this is for non-urgent requests.  _______________________________________________________ Thank you for trusting me with your gastrointestinal care!   Suzanna Erp, PA

## 2023-06-30 ENCOUNTER — Telehealth: Payer: Self-pay | Admitting: Gastroenterology

## 2023-06-30 MED ORDER — SUTAB 1479-225-188 MG PO TABS: ORAL_TABLET | ORAL | 0 refills | Status: DC

## 2023-06-30 NOTE — Telephone Encounter (Signed)
 Heavenly- The prep needs to be re-sent to the pharmacy and the patient needs to be called and informed that it has been re-sent. Regardless to the fact that you may have sent it the day that patient was seen in office, it does occasionally happen that pharmacies tell the patient that the prescription was not received. In the future, please do not route theses types of encounters back to our schedulers.

## 2023-06-30 NOTE — Telephone Encounter (Signed)
 Inbound call from patient, states pharmacy stated they do not have prep medication, confirmed [facility] on [address], patient would like it resent.

## 2023-06-30 NOTE — Addendum Note (Signed)
 Addended by: Lanora Plana on: 06/30/2023 04:38 PM   Modules accepted: Orders

## 2023-07-01 NOTE — Progress Notes (Signed)
 Agree with the assessment and plan as outlined by Suzanna Erp, PA-C.  Unclear why patient experienced transient small bowel obstruction with no abdominal surgical history.  If patient has any recurrent symptoms suggestive of intestinal obstruction, would recommend CT enterography to further evaluate small bowel.

## 2023-07-08 ENCOUNTER — Encounter: Admitting: Gastroenterology

## 2023-07-14 NOTE — Progress Notes (Unsigned)
 [name] D.[facility] [address] [place] [address] Phone: [phone]   Assessment and Plan:    1. Chronic pain of right knee 2. Other secondary osteoarthritis of right knee - Ambulatory referral to Physical Therapy  -Chronic with exacerbation, initial sports medicine visit - Consistent with flare of osteoarthritis likely advanced due to history of right knee surgery in 2010.  Patient is unsure of what surgery specifically, though believes it was a meniscectomy - X-ray obtained in clinic.  My interpretation: No acute fracture or dislocation.  Tricompartmental degenerative joint disease - Patient elected for intra-articular CSI.  Tolerated well per note below - Start HEP and physical therapy for knee   Procedure: Knee Joint Injection Side: Right Indication: Flare of osteoarthritis  Risks explained and consent was given verbally. The site was cleaned with alcohol prep. A needle was introduced with an anterio-lateral approach. Injection given using 2mL of 1% lidocaine  without epinephrine and 1mL of kenalog 40mg /ml. This was well tolerated and resulted in symptomatic relief.  Needle was removed, hemostasis achieved, and post injection instructions were explained.   Pt was advised to call or return to clinic if these symptoms worsen or fail to improve as anticipated.   15 additional minutes spent for educating Therapeutic Home Exercise Program.  This included exercises focusing on stretching, strengthening, with focus on eccentric aspects.   Long term goals include an improvement in range of motion, strength, endurance as well as avoiding reinjury. Patient's frequency would include in 1-2 times a day, 3-5 times a week for a duration of 6-12 weeks. Proper technique shown and discussed handout in great detail with ATC.  All questions were discussed and answered.    Pertinent previous records reviewed include none  Follow Up: 4 weeks for  reevaluation.  If no improvement or worsening of symptoms, could discuss NSAID versus prednisone course versus alternative injection therapy   Subjective:   I, Galilea Quito, am serving as a Neurosurgeon for Doctor Ulysees Gander  Chief Complaint: right knee pain   HPI:   07/15/2023 Patient is a 53 year old male with right knee pain. Patient states pain when he sits and lays for a period of time. Isnt able to sleep through the night. 3-4 months of pain. Decreased ROM. No meds for the pain. No numbness or tingling. Pain radiates down the leg. Hx of surgery 15 years ago.    Relevant Historical Information: GERD  Additional pertinent review of systems negative.   Current Outpatient Medications:    Sodium Sulfate-Mag Sulfate-KCl (SUTAB ) [phone] MG TABS, Use as directed for colonoscopy. MANUFACTURER CODES!! BIN: [id number] PCN: CN GROUP: [id number] MEMBER ID: [id number];KGM AS SECONDARY INSURANCE ;NO PRIOR AUTHORIZATION, Disp: 24 tablet, Rfl: 0   Sodium Sulfate-Mag Sulfate-KCl (SUTAB ) [phone] MG TABS, Use as directed for colonoscopy. MANUFACTURER CODES!! BIN: [id number] PCN: CN GROUP: [id number] MEMBER ID: [id number];IRJ AS SECONDARY INSURANCE ;NO PRIOR AUTHORIZATION, Disp: 24 tablet, Rfl: 0   Objective:     Vitals:   07/15/23 0850  BP: 110/78  Pulse: 81  SpO2: 97%  Weight: 181 lb (82.1 kg)  Height: 5\' 11"  (1.803 m)      Body mass index is 25.24 kg/m.    Physical Exam:    General:  awake, alert oriented, no acute distress nontoxic Skin: no suspicious lesions or rashes Neuro:sensation intact and strength 5/5 with no deficits, no atrophy, normal muscle tone Psych: No signs of anxiety, depression or other mood disorder  Right knee:  Crepitus present No swelling No deformity Neg fluid wave, joint milking ROM Flex 110, Ext 0 TTP medial and lateral joint line NTTP over the quad tendon, medial fem condyle, lat fem condyle, patella, plica, patella tendon, tibial tuberostiy, fibular  head, posterior fossa, pes anserine bursa, gerdy's tubercle,   Neg anterior and posterior drawer Neg lachman Neg sag sign Negative varus stress Negative valgus stress Negative McMurray Positive Thessaly  Gait normal    Electronically signed by:  Marshall Skeeter D.[facility] 9:08 AM 07/15/23

## 2023-07-15 ENCOUNTER — Ambulatory Visit (INDEPENDENT_AMBULATORY_CARE_PROVIDER_SITE_OTHER)

## 2023-07-15 ENCOUNTER — Ambulatory Visit: Admitting: Sports Medicine

## 2023-07-15 VITALS — BP 110/78 | HR 81 | Ht 71.0 in | Wt 181.0 lb

## 2023-07-15 DIAGNOSIS — G8929 Other chronic pain: Secondary | ICD-10-CM

## 2023-07-15 DIAGNOSIS — M25561 Pain in right knee: Secondary | ICD-10-CM

## 2023-07-15 DIAGNOSIS — M175 Other unilateral secondary osteoarthritis of knee: Secondary | ICD-10-CM | POA: Diagnosis not present

## 2023-07-15 DIAGNOSIS — M1711 Unilateral primary osteoarthritis, right knee: Secondary | ICD-10-CM | POA: Diagnosis not present

## 2023-07-15 NOTE — Therapy (Deleted)
 OUTPATIENT PHYSICAL THERAPY LOWER EXTREMITY EVALUATION   Patient Name: Luke Black MRN: [id number] DOB:09-May-1970, 53 y.o., male Today's Date: 07/15/2023  END OF SESSION:   Past Medical History:  Diagnosis Date   Heart murmur    Past Surgical History:  Procedure Laterality Date   KNEE SURGERY Right 2010   KNEE SURGERY     Patient Active Problem List   Diagnosis Date Noted   SBO (small bowel obstruction) (HCC) 05/07/2023   GERD (gastroesophageal reflux disease) 05/07/2023   Tobacco abuse 05/07/2023    PCP: Pcp, No   REFERRING PROVIDER: Ulysees Gander, DO  REFERRING DIAG: [id number] (ICD-10-CM) - Chronic pain of right knee M17.11 (ICD-10-CM) - Primary osteoarthritis of right knee  THERAPY DIAG:  No diagnosis found.  Rationale for Evaluation and Treatment: Rehabilitation  ONSET DATE: 3-4 months  SUBJECTIVE:   SUBJECTIVE STATEMENT: Patient is a 53 year old male with right knee pain. Patient states pain when he sits and lays for a period of time. Isnt able to sleep through the night. 3-4 months of pain. Decreased ROM. No meds for the pain. No numbness or tingling. Pain radiates down the leg. Hx of surgery 15 years ago.   PERTINENT HISTORY: 1. Chronic pain of right knee 2. Other secondary osteoarthritis of right knee - Ambulatory referral to Physical Therapy  -Chronic with exacerbation, initial sports medicine visit - Consistent with flare of osteoarthritis likely advanced due to history of right knee surgery in 2010.  Patient is unsure of what surgery specifically, though believes it was a meniscectomy - X-ray obtained in clinic.  My interpretation: No acute fracture or dislocation.  Tricompartmental degenerative joint disease - Patient elected for intra-articular CSI.  Tolerated well per note below - Start HEP and physical therapy for knee PAIN:  Are you having pain? {OPRCPAIN:27236}  PRECAUTIONS: None  RED FLAGS: None   WEIGHT BEARING RESTRICTIONS:  No  FALLS:  Has patient fallen in last 6 months? No  OCCUPATION: ***  PLOF: Independent  PATIENT GOALS: ***  NEXT MD VISIT: ***  OBJECTIVE:  Note: Objective measures were completed at Evaluation unless otherwise noted.  DIAGNOSTIC FINDINGS: ***  PATIENT SURVEYS:  LEFS ***  MUSCLE LENGTH: Hamstrings: Right *** deg; Left *** deg Andy Bannister test: Right *** deg; Left *** deg  POSTURE: {posture:25561}  PALPATION: ***  LOWER EXTREMITY ROM:  {AROM/PROM:27142} ROM Right eval Left eval  Hip flexion    Hip extension    Hip abduction    Hip adduction    Hip internal rotation    Hip external rotation    Knee flexion    Knee extension    Ankle dorsiflexion    Ankle plantarflexion    Ankle inversion    Ankle eversion     (Blank rows = not tested)  LOWER EXTREMITY MMT:  MMT Right eval Left eval  Hip flexion    Hip extension    Hip abduction    Hip adduction    Hip internal rotation    Hip external rotation    Knee flexion    Knee extension    Ankle dorsiflexion    Ankle plantarflexion    Ankle inversion    Ankle eversion     (Blank rows = not tested)  LOWER EXTREMITY SPECIAL TESTS:  {LEspecialtests:26242}  FUNCTIONAL TESTS:  30 seconds chair stand test  GAIT: Distance walked: 24ft x2 Assistive device utilized: {Assistive devices:23999} Level of assistance: {Levels of assistance:24026} Comments: ***  TREATMENT DATE: ***    PATIENT EDUCATION:  Education details: Discussed eval findings, rehab rationale and POC and patient is in agreement  Person educated: Patient Education method: Explanation Education comprehension: verbalized understanding and needs further education  HOME EXERCISE PROGRAM: ***  ASSESSMENT:  CLINICAL IMPRESSION: Patient is a 53 y.o. male who was seen today for physical therapy evaluation and  treatment for ***.   OBJECTIVE IMPAIRMENTS: {opptimpairments:25111}.   ACTIVITY LIMITATIONS: {activitylimitations:27494}  PERSONAL FACTORS: {Personal factors:25162} are also affecting patient's functional outcome.   REHAB POTENTIAL: Good  CLINICAL DECISION MAKING: Stable/uncomplicated  EVALUATION COMPLEXITY: Low   GOALS: Goals reviewed with patient? No  SHORT TERM GOALS: Target date: 08/05/2023   Patient to demonstrate independence in HEP  Baseline: Goal status: INITIAL  2.  *** Baseline:  Goal status: INITIAL  3.  *** Baseline:  Goal status: INITIAL  4.  *** Baseline:  Goal status: INITIAL  5.  *** Baseline:  Goal status: INITIAL  6.  *** Baseline:  Goal status: INITIAL  LONG TERM GOALS: Target date: 08/26/2023  Patient will increase 30s chair stand reps from *** to *** with/without arms to demonstrate and improved functional ability with less pain/difficulty as well as reduce fall risk.  Baseline:  Goal status: INITIAL  2.  Patient will acknowledge ***/10 pain at least once during episode of care   Baseline:  Goal status: INITIAL  3.  Patient will score at least  on LEFS to signify clinically meaningful improvement in functional abilities.   Baseline:  Goal status: INITIAL  4.  *** Baseline:  Goal status: INITIAL  5.  *** Baseline:  Goal status: INITIAL  6.  *** Baseline:  Goal status: INITIAL   PLAN:  PT FREQUENCY: 1-2x/week  PT DURATION: 6 weeks  PLANNED INTERVENTIONS: 97164- PT Re-evaluation, 97110-Therapeutic exercises, 97530- Therapeutic activity, 97112- Neuromuscular re-education, 97535- Self Care, 82956- Manual therapy, [phone]- Gait training, and Patient/Family education  PLAN FOR NEXT SESSION: HEP review and update, manual techniques as appropriate, aerobic tasks, ROM and flexibility activities, strengthening and PREs, TPDN, gait and balance training as needed     Eldon Greenland, PT 07/15/2023, 3:20 PM

## 2023-07-15 NOTE — Patient Instructions (Addendum)
 Knee HEP  PT referral  4 week follow up

## 2023-07-16 ENCOUNTER — Ambulatory Visit

## 2023-07-16 ENCOUNTER — Encounter: Payer: Self-pay | Admitting: Sports Medicine

## 2023-07-30 ENCOUNTER — Encounter: Payer: Self-pay | Admitting: Gastroenterology

## 2023-08-04 NOTE — Therapy (Deleted)
 OUTPATIENT PHYSICAL THERAPY LOWER EXTREMITY EVALUATION  Patient Name: Luke Black MRN: [id number] DOB:April 07, 1970, 53 y.o., male Today's Date: 08/04/2023    Past Medical History:  Diagnosis Date   Heart murmur    Past Surgical History:  Procedure Laterality Date   KNEE SURGERY Right 2010   KNEE SURGERY     Patient Active Problem List   Diagnosis Date Noted   SBO (small bowel obstruction) (HCC) 05/07/2023   GERD (gastroesophageal reflux disease) 05/07/2023   Tobacco abuse 05/07/2023    PCP: Pcp, No  REFERRING PROVIDER: Ulysees Gander, DO  THERAPY DIAG:  No diagnosis found.  REFERRING DIAG: Chronic pain of right knee [M25.561, G89.29], Primary osteoarthritis of right knee [M17.11]   Rationale for Evaluation and Treatment:  Rehabilitation  SUBJECTIVE:  PERTINENT PAST HISTORY:  R knee surgery 2010        PRECAUTIONS: {Therapy precautions:24002}  WEIGHT BEARING RESTRICTIONS {Yes ***/No:24003}  FALLS:  Has patient fallen in last 6 months? {yes/no:20286}, Number of falls: ***  MOI/History of condition:  Onset date: ***  SUBJECTIVE STATEMENT  Luke Black is a 53 y.o. male who presents to clinic with chief complaint of ***.  ***   Red flags:  {has/denies:26543} {kerredflag:26542}  Pain:  Are you having pain? {yes/no:20286} Pain location: *** NPRS scale:  {NUMBERS; 0-10:5044}/10 to {NUMBERS; 0-10:5044}/10 Aggravating factors: *** Relieving factors: *** Pain description: {PAIN DESCRIPTION:21022940} Stage: {Desc; acute/subacute/chronic:13799} 24 hour pattern: ***   Occupation: ***  Assistive Device: ***  Hand Dominance: ***  Patient Goals/Specific Activities: ***   OBJECTIVE:   DIAGNOSTIC FINDINGS:  ***  GENERAL OBSERVATION/GAIT: ***  SENSATION: Light touch: {intact/deficits:24005}  PALPATION: ***  MUSCLE LENGTH: Hamstrings: Right {kerminsig:27227} restriction; Left {kerminsig:27227} restriction Hip flexors: Right  {kerminsig:27227} restriction; Left {kerminsig:27227} restriction Rec Fem: Right {kerminsig:27227} restriction; Left {kerminsig:27227} restriction  LE MMT:  MMT Right (Eval) Left (Eval)  Hip flexion (L2, L3) *** ***  Knee extension (L3) *** ***  Knee flexion *** ***  Hip abduction *** ***  Hip extension *** ***  Hip external rotation    Hip internal rotation    Hip adduction    Ankle dorsiflexion (L4)    Ankle plantarflexion (S1)    Ankle inversion    Ankle eversion    Great Toe ext (L5)    Grossly     (Blank rows = not tested, score listed is out of 5 possible points.  N = WNL, D = diminished, C = clear for gross weakness with myotome testing, * = concordant pain with testing)  LE ROM:  ROM Right (Eval) Left (Eval)  Hip flexion    Hip extension    Hip abduction    Hip adduction    Hip internal rotation    Hip external rotation    Knee extension    Knee flexion    Ankle dorsiflexion    Ankle plantarflexion    Ankle inversion    Ankle eversion     (Blank rows = not tested, N = WNL, * = concordant pain with testing)  Functional Tests  Eval                                                              SPECIAL TESTS:  ***   PATIENT SURVEYS:  ***   TODAY'S  TREATMENT: ***   PATIENT EDUCATION ([place]/HM):  POC, diagnosis, prognosis, HEP, and outcome measures.  Pt educated via explanation, demonstration, and handout (HEP).  Pt confirms understanding verbally.   HOME EXERCISE PROGRAM: ***  Treatment priorities   Eval                                                  ASSESSMENT:  CLINICAL IMPRESSION: Luke Black is a 53 y.o. male who presents to clinic with signs and sxs consistent with ***.    OBJECTIVE IMPAIRMENTS: Pain, ***  ACTIVITY LIMITATIONS: ***  PERSONAL FACTORS: See medical history and pertinent history   REHAB POTENTIAL: {rehabpotential:25112}  CLINICAL DECISION MAKING: {clinical decision making:25114}  EVALUATION  COMPLEXITY: {Evaluation complexity:25115}   GOALS:   SHORT TERM GOALS: Target date: ***  Luke Black will be >75% HEP compliant to improve carryover between sessions and facilitate independent management of condition  Evaluation: ongoing Goal status: INITIAL   LONG TERM GOALS: Target date: ***  Luke Black will self report >/= 50% decrease in pain from evaluation to improve function in daily tasks  Evaluation/Baseline: ***/10 max pain Goal status: INITIAL   2.  ***   3.  ***   4.  ***   5.  ***   6.  ***   PLAN: PT FREQUENCY: 1-2x/week  PT DURATION: 8 weeks  PLANNED INTERVENTIONS:  97164- PT Re-evaluation, 97110-Therapeutic exercises, 97530- Therapeutic activity, 97112- Neuromuscular re-education, 97535- Self Care, 69629- Manual therapy, [id number]- Gait training, [id number]- Aquatic Therapy, [id number]- Electrical stimulation (manual), [id number]- Vasopneumatic device, [id number]- Traction (mechanical), [id number]- Ionotophoresis 4mg /ml Dexamethasone, Taping, Dry Needling, Joint manipulation, and Spinal manipulation.   Doniven Vanpatten PT, DPT 08/04/2023, 2:27 PM

## 2023-08-05 ENCOUNTER — Ambulatory Visit: Admitting: Physical Therapy

## 2023-08-11 NOTE — Progress Notes (Deleted)
    Luke [facility] D.[facility] [address] [place] [address] Phone: [phone]   Assessment and Plan:     There are no diagnoses linked to this encounter.  ***   Pertinent previous records reviewed include ***    Follow Up: ***     Subjective:   I, Luke Black, am serving as a Neurosurgeon for Doctor Luke Black   Chief Complaint: right knee pain    HPI:    07/15/2023 Patient is a 53 year old male with right knee pain. Patient states pain when he sits and lays for a period of time. Isnt able to sleep through the night. 3-4 months of pain. Decreased ROM. No meds for the pain. No numbness or tingling. Pain radiates down the leg. Hx of surgery 15 years ago.    08/12/2023 Patient states   Relevant Historical Information: GERD    Additional pertinent review of systems negative.   Current Outpatient Medications:    Sodium Sulfate-Mag Sulfate-KCl (SUTAB ) [phone] MG TABS, Use as directed for colonoscopy. MANUFACTURER CODES!! BIN: [id number] PCN: CN GROUP: [id number] MEMBER ID: [id number];NGE AS SECONDARY INSURANCE ;NO PRIOR AUTHORIZATION, Disp: 24 tablet, Rfl: 0   Sodium Sulfate-Mag Sulfate-KCl (SUTAB ) [phone] MG TABS, Use as directed for colonoscopy. MANUFACTURER CODES!! BIN: [id number] PCN: CN GROUP: [id number] MEMBER ID: [id number];DGL AS SECONDARY INSURANCE ;NO PRIOR AUTHORIZATION, Disp: 24 tablet, Rfl: 0   Objective:     There were no vitals filed for this visit.    There is no height or weight on file to calculate BMI.    Physical Exam:    ***   Electronically signed by:  Luke Black [facility] 7:47 AM 08/11/23

## 2023-08-12 ENCOUNTER — Encounter: Admitting: Gastroenterology

## 2023-08-12 ENCOUNTER — Ambulatory Visit: Admitting: Sports Medicine

## 2023-08-19 ENCOUNTER — Ambulatory Visit: Attending: Sports Medicine | Admitting: Physical Therapy

## 2023-08-19 NOTE — Therapy (Incomplete)
 OUTPATIENT PHYSICAL THERAPY LOWER EXTREMITY EVALUATION  Patient Name: Luke Black MRN: [id number] DOB:Feb 23, 1971, 53 y.o., male Today's Date: 08/19/2023    Past Medical History:  Diagnosis Date   Heart murmur    Past Surgical History:  Procedure Laterality Date   KNEE SURGERY Right 2010   KNEE SURGERY     Patient Active Problem List   Diagnosis Date Noted   SBO (small bowel obstruction) (HCC) 05/07/2023   GERD (gastroesophageal reflux disease) 05/07/2023   Tobacco abuse 05/07/2023    PCP: Pcp, No  REFERRING PROVIDER: Ulysees Gander, DO  THERAPY DIAG:  No diagnosis found.  REFERRING DIAG: Chronic pain of right knee [M25.561, G89.29], Primary osteoarthritis of right knee [M17.11]   Rationale for Evaluation and Treatment:  Rehabilitation  SUBJECTIVE:  PERTINENT PAST HISTORY:  R knee surgery 2010        PRECAUTIONS: {Therapy precautions:24002}  WEIGHT BEARING RESTRICTIONS {Yes ***/No:24003}  FALLS:  Has patient fallen in last 6 months? {yes/no:20286}, Number of falls: ***  MOI/History of condition:  Onset date: ***  SUBJECTIVE STATEMENT  Luke Black is a 53 y.o. male who presents to clinic with chief complaint of ***.  ***   Red flags:  {has/denies:26543} {kerredflag:26542}  Pain:  Are you having pain? {yes/no:20286} Pain location: *** NPRS scale:  {NUMBERS; 0-10:5044}/10 to {NUMBERS; 0-10:5044}/10 Aggravating factors: *** Relieving factors: *** Pain description: {PAIN DESCRIPTION:21022940} Stage: {Desc; acute/subacute/chronic:13799} 24 hour pattern: ***   Occupation: ***  Assistive Device: ***  Hand Dominance: ***  Patient Goals/Specific Activities: ***   OBJECTIVE:   DIAGNOSTIC FINDINGS:   EXAM: RIGHT KNEE 3 VIEWS   COMPARISON:  None Available.   FINDINGS: Mild tricompartmental peripheral spurring. Slight tibiofemoral joint space narrowing. No evidence of acute or healed fracture. No erosions or focal bone abnormality.  No significant joint effusion. Unremarkable soft tissues.   IMPRESSION: Mild osteoarthritis.  GENERAL OBSERVATION/GAIT: ***  SENSATION: Light touch: {intact/deficits:24005}  PALPATION: ***  MUSCLE LENGTH: Hamstrings: Right {kerminsig:27227} restriction; Left {kerminsig:27227} restriction Hip flexors: Right {kerminsig:27227} restriction; Left {kerminsig:27227} restriction Rec Fem: Right {kerminsig:27227} restriction; Left {kerminsig:27227} restriction  LE MMT:  MMT Right (Eval) Left (Eval)  Hip flexion (L2, L3) *** ***  Knee extension (L3) *** ***  Knee flexion *** ***  Hip abduction *** ***  Hip extension *** ***  Hip external rotation    Hip internal rotation    Hip adduction    Ankle dorsiflexion (L4)    Ankle plantarflexion (S1)    Ankle inversion    Ankle eversion    Great Toe ext (L5)    Grossly     (Blank rows = not tested, score listed is out of 5 possible points.  N = WNL, D = diminished, C = clear for gross weakness with myotome testing, * = concordant pain with testing)  LE ROM:  ROM Right (Eval) Left (Eval)  Hip flexion    Hip extension    Hip abduction    Hip adduction    Hip internal rotation    Hip external rotation    Knee extension    Knee flexion    Ankle dorsiflexion    Ankle plantarflexion    Ankle inversion    Ankle eversion     (Blank rows = not tested, N = WNL, * = concordant pain with testing)  Functional Tests  Eval  SPECIAL TESTS:  ***   PATIENT SURVEYS:  ***   TODAY'S TREATMENT: ***   PATIENT EDUCATION ([place]/[facility]):  POC, diagnosis, prognosis, HEP, and outcome measures.  Pt educated via explanation, demonstration, and handout (HEP).  Pt confirms understanding verbally.   HOME EXERCISE PROGRAM: ***  Treatment priorities   Eval                                                  ASSESSMENT:  CLINICAL IMPRESSION: [name] is a 53 y.o.  male who presents to clinic with signs and sxs consistent with ***.    OBJECTIVE IMPAIRMENTS: Pain, ***  ACTIVITY LIMITATIONS: ***  PERSONAL FACTORS: See medical history and pertinent history   REHAB POTENTIAL: {rehabpotential:25112}  CLINICAL DECISION MAKING: {clinical decision making:25114}  EVALUATION COMPLEXITY: {Evaluation complexity:25115}   GOALS:   SHORT TERM GOALS: Target date: ***  [name] will be >75% HEP compliant to improve carryover between sessions and facilitate independent management of condition  Evaluation: ongoing Goal status: INITIAL   LONG TERM GOALS: Target date: ***  [name] will self report >/= 50% decrease in pain from evaluation to improve function in daily tasks  Evaluation/Baseline: ***/10 max pain Goal status: INITIAL   2.  ***   3.  ***   4.  ***   5.  ***   6.  ***   PLAN: PT FREQUENCY: 1-2x/week  PT DURATION: 8 weeks  PLANNED INTERVENTIONS:  97164- PT Re-evaluation, 97110-Therapeutic exercises, 97530- Therapeutic activity, 97112- Neuromuscular re-education, 97535- Self Care, [address]- Manual therapy, [id number]- Gait training, [id number]- Aquatic Therapy, [id number]- Electrical stimulation (manual), [id number]- Vasopneumatic device, [id number]- Traction (mechanical), [id number]- Ionotophoresis 4mg /ml Dexamethasone, Taping, Dry Needling, Joint manipulation, and Spinal manipulation.   Luke Black PT, DPT 08/19/2023, 7:16 AM

## 2023-09-07 ENCOUNTER — Encounter: Payer: Self-pay | Admitting: Gastroenterology

## 2023-09-07 ENCOUNTER — Ambulatory Visit: Admitting: Gastroenterology

## 2023-09-07 VITALS — BP 126/80 | HR 56 | Temp 98.2°F | Resp 15 | Ht 71.0 in | Wt 180.0 lb

## 2023-09-07 DIAGNOSIS — D123 Benign neoplasm of transverse colon: Secondary | ICD-10-CM | POA: Diagnosis not present

## 2023-09-07 DIAGNOSIS — Z1211 Encounter for screening for malignant neoplasm of colon: Secondary | ICD-10-CM

## 2023-09-07 DIAGNOSIS — K5730 Diverticulosis of large intestine without perforation or abscess without bleeding: Secondary | ICD-10-CM | POA: Diagnosis not present

## 2023-09-07 MED ORDER — SODIUM CHLORIDE 0.9 % IV SOLN: 500.0000 mL | Freq: Once | INTRAVENOUS | Status: DC

## 2023-09-07 NOTE — Progress Notes (Signed)
 [facility] History and Physical   Primary Care Physician:  Pcp, No   Reason for Procedure:   Colon cancer screening  Plan:    Screening colonoscopy     HPI: Luke Black is a 53 y.o. male undergoing initial average risk screening colonoscopy.  He has no family history of colon cancer and no chronic GI symptoms.  He had an admission for small bowel obstruction in Februrary.  No persistent symptoms   Past Medical History:  Diagnosis Date   Heart murmur     Past Surgical History:  Procedure Laterality Date   KNEE SURGERY Right 2010   KNEE SURGERY      Prior to Admission medications   Medication Sig Start Date End Date Taking? Authorizing Provider  amoxicillin (AMOXIL) 875 MG tablet Take 875 mg by mouth 2 (two) times daily. Patient not taking: Reported on 09/07/2023 07/08/23   [provider]    Current Outpatient Medications  Medication Sig Dispense Refill   amoxicillin (AMOXIL) 875 MG tablet Take 875 mg by mouth 2 (two) times daily. (Patient not taking: Reported on 09/07/2023)     Current Facility-Administered Medications  Medication Dose Route Frequency Provider Last Rate Last Admin   0.9 %  sodium chloride  infusion  500 mL Intravenous Once Stacia Glendia BRAVO, MD        Allergies as of 09/07/2023   (No Known Allergies)    Family History  Problem Relation Age of Onset   Lung cancer Father    Stomach cancer Other    Liver disease Neg Hx    Colon cancer Neg Hx    Esophageal cancer Neg Hx     Social History   Socioeconomic History   Marital status: Single    Spouse name: Not on file   Number of children: 2   Years of education: Not on file   Highest education level: Not on file  Occupational History   Occupation: postal service  Tobacco Use   Smoking status: Every Day    Types: Cigarettes   Smokeless tobacco: Never  Vaping Use   Vaping status: Some Days  Substance and Sexual Activity   Alcohol use: Yes    Comment: occasional    Drug use: Yes    Types: Marijuana   Sexual activity: Not on file  Other Topics Concern   Not on file  Social History Narrative   Not on file   Social Drivers of Health   Financial Resource Strain: Not on file  Food Insecurity: No Food Insecurity (05/07/2023)   Hunger Vital Sign    Worried About Running Out of Food in the Last Year: Never true    Ran Out of Food in the Last Year: Never true  Transportation Needs: No Transportation Needs (05/07/2023)   PRAPARE - Transportation    Lack of Transportation (Medical): No    Lack of Transportation (Non-Medical): No  Physical Activity: Not on file  Stress: Not on file  Social Connections: Unknown (09/21/2022)   Received from [facility]   Social Network    Social Network: Not on file  Intimate Partner Violence: Not At Risk (05/07/2023)   Humiliation, Afraid, Rape, and Kick questionnaire    Fear of Current or Ex-Partner: No    Emotionally Abused: No    Physically Abused: No    Sexually Abused: No    Review of Systems:  All other review of systems negative except as mentioned in the HPI.  Physical Exam: Vital signs BP 118/69  Pulse (!) 59   Temp 98.2 F (36.8 C)   Ht 5' 11 (1.803 m)   Wt 180 lb (81.6 kg)   SpO2 98%   BMI 25.10 kg/m   General:   Alert,  Well-developed, well-nourished, pleasant and cooperative in NAD Airway:  Mallampati 1 Lungs:  Clear throughout to auscultation.   Heart:  Regular rate and rhythm; no murmurs, clicks, rubs,  or gallops. Abdomen:  Soft, nontender and nondistended. Normal bowel sounds.   Neuro/Psych:  Normal mood and affect. A and O x 3   Hadriel Northup E. Stacia, MD [facility]

## 2023-09-07 NOTE — Patient Instructions (Signed)
Resume previous diet Continue present medications Await pathology results  Handouts/information given for polyps, diverticulosis  YOU HAD AN ENDOSCOPIC PROCEDURE TODAY AT THE [facility]:   Refer to the procedure report that was given to you for any specific questions about what was found during the examination.  If the procedure report does not answer your questions, please call your gastroenterologist to clarify.  If you requested that your care partner not be given the details of your procedure findings, then the procedure report has been included in a sealed envelope for you to review at your convenience later.  YOU SHOULD EXPECT: Some feelings of bloating in the abdomen. Passage of more gas than usual.  Walking can help get rid of the air that was put into your GI tract during the procedure and reduce the bloating. If you had a lower endoscopy (such as a colonoscopy or flexible sigmoidoscopy) you may notice spotting of blood in your stool or on the toilet paper. If you underwent a bowel prep for your procedure, you may not have a normal bowel movement for a few days.  Please Note:  You might notice some irritation and congestion in your nose or some drainage.  This is from the oxygen used during your procedure.  There is no need for concern and it should clear up in a day or so.  SYMPTOMS TO REPORT IMMEDIATELY:  Following lower endoscopy (colonoscopy):  Excessive amounts of blood in the stool  Significant tenderness or worsening of abdominal pains  Swelling of the abdomen that is new, acute  Fever of 100F or higher  For urgent or emergent issues, a gastroenterologist can be reached at any hour by calling [phone]. Do not use MyChart messaging for urgent concerns.    DIET:  We do recommend a small meal at first, but then you may proceed to your regular diet.  Drink plenty of fluids but you should avoid alcoholic beverages for 24 hours.  ACTIVITY:  You should plan to  take it easy for the rest of today and you should NOT DRIVE or use heavy machinery until tomorrow (because of the sedation medicines used during the test).    FOLLOW UP: Our staff will call the number listed on your records the next business day following your procedure.  We will call around 7:15- 8:00 am to check on you and address any questions or concerns that you may have regarding the information given to you following your procedure. If we do not reach you, we will leave a message.     If any biopsies were taken you will be contacted by phone or by letter within the next 1-3 weeks.  Please call us at [phone] if you have not heard about the biopsies in 3 weeks.    SIGNATURES/CONFIDENTIALITY: You and/or your care partner have signed paperwork which will be entered into your electronic medical record.  These signatures attest to the fact that that the information above on your After Visit Summary has been reviewed and is understood.  Full responsibility of the confidentiality of this discharge information lies with you and/or your care-partner. 

## 2023-09-07 NOTE — Progress Notes (Signed)
 Pt's states no medical or surgical changes since previsit or office visit.

## 2023-09-07 NOTE — Op Note (Signed)
  [facility] Patient Name: Luke Black Procedure Date: 09/07/2023 3:34 PM MRN: [id number] Endoscopist: Glendia E. Stacia , MD, [id number] Age: 53 Referring MD:  Date of Birth: April 11, 1970 Gender: Male Account #: [id number] Procedure:                Colonoscopy Indications:              Screening for colorectal malignant neoplasm, This                            is the patient's first colonoscopy Medicines:                Monitored Anesthesia Care Procedure:                Pre-Anesthesia Assessment:                           - Prior to the procedure, a History and Physical                            was performed, and patient medications and                            allergies were reviewed. The patient's tolerance of                            previous anesthesia was also reviewed. The risks                            and benefits of the procedure and the sedation                            options and risks were discussed with the patient.                            All questions were answered, and informed consent                            was obtained. Prior Anticoagulants: The patient has                            taken no anticoagulant or antiplatelet agents. ASA                            Grade Assessment: II - A patient with mild systemic                            disease. After reviewing the risks and benefits,                            the patient was deemed in satisfactory condition to                            undergo the procedure.  After obtaining informed consent, the colonoscope                            was passed under direct vision. Throughout the                            procedure, the patient's blood pressure, pulse, and                            oxygen saturations were monitored continuously. The                            Olympus Scope SN: [id number] was introduced through                            the anus and  advanced to the the terminal ileum,                            with identification of the appendiceal orifice and                            IC valve. The colonoscopy was performed without                            difficulty. The patient tolerated the procedure                            well. The quality of the bowel preparation was                            good. The terminal ileum, ileocecal valve,                            appendiceal orifice, and rectum were photographed.                            The bowel preparation used was SUTAB  via split dose                            instruction. Scope In: 3:52:14 PM Scope Out: 4:07:05 PM Scope Withdrawal Time: 0 hours 10 minutes 42 seconds  Total Procedure Duration: 0 hours 14 minutes 51 seconds  Findings:                 The perianal and digital rectal examinations were                            normal. Pertinent negatives include normal                            sphincter tone and no palpable rectal lesions.                           A 3 mm polyp was found in the mid transverse colon.  The polyp was sessile. The polyp was removed with a                            cold snare. Resection and retrieval were complete.                            Estimated blood loss was minimal.                           A few small-mouthed diverticula were found in the                            sigmoid colon and ascending colon.                           The exam was otherwise normal throughout the                            examined colon.                           The terminal ileum appeared normal.                           The retroflexed view of the distal rectum and anal                            verge was normal and showed no anal or rectal                            abnormalities. Complications:            No immediate complications. Estimated Blood Loss:     Estimated blood loss was minimal. Impression:                - One 3 mm polyp in the mid transverse colon,                            removed with a cold snare. Resected and retrieved.                           - Mild diverticulosis in the sigmoid colon and in                            the ascending colon.                           - The examined portion of the ileum was normal.                           - The distal rectum and anal verge are normal on                            retroflexion view. Recommendation:           - Patient  has a contact number available for                            emergencies. The signs and symptoms of potential                            delayed complications were discussed with the                            patient. Return to normal activities tomorrow.                            Written discharge instructions were provided to the                            patient.                           - Resume previous diet.                           - Continue present medications.                           - Await pathology results.                           - Repeat colonoscopy (date not yet determined) for                            surveillance based on pathology results. Conita Amenta E. Stacia, MD 09/07/2023 4:14:52 PM This report has been signed electronically.

## 2023-09-07 NOTE — Progress Notes (Signed)
 Sedate, gd SR, tolerated procedure well, VSS, report to RN

## 2023-09-07 NOTE — Progress Notes (Signed)
 Called to room to assist during endoscopic procedure.  Patient ID and intended procedure confirmed with present staff. Received instructions for my participation in the procedure from the performing physician.

## 2023-09-08 ENCOUNTER — Telehealth: Payer: Self-pay

## 2023-09-08 NOTE — Telephone Encounter (Signed)
  Follow up Call-     09/07/2023    3:12 PM  Call back number  Post procedure Call Back phone  # [phone]  Permission to leave phone message Yes     Patient questions:  Do you have a fever, pain , or abdominal swelling? No. Pain Score  0 *  Have you tolerated food without any problems? Yes.    Have you been able to return to your normal activities? Yes.    Do you have any questions about your discharge instructions: Diet   No. Medications  No. Follow up visit  No.  Do you have questions or concerns about your Care? No.  Actions: * If pain score is 4 or above: No action needed, pain <4.

## 2023-09-14 ENCOUNTER — Encounter: Payer: Self-pay | Admitting: Sports Medicine

## 2023-09-14 LAB — SURGICAL PATHOLOGY

## 2023-09-16 ENCOUNTER — Ambulatory Visit: Payer: Self-pay | Admitting: Gastroenterology

## 2023-09-16 NOTE — Progress Notes (Signed)
 Luke Black,  The polyp which I removed during your recent procedure was proven to be completely benign but is considered a pre-cancerous polyp that MAY have grown into cancer if it had not been removed.  Studies shows that at least 20% of women over age 51 and 30% of men over age 40 have pre-cancerous polyps.  Based on current nationally recognized surveillance guidelines, I recommend that you have a repeat colonoscopy in 7 years.   If you develop any new rectal bleeding, abdominal pain or significant bowel habit changes, please contact me before then.

## 2023-11-26 ENCOUNTER — Telehealth: Admitting: Physician Assistant

## 2023-11-26 DIAGNOSIS — R1310 Dysphagia, unspecified: Secondary | ICD-10-CM

## 2023-11-26 NOTE — Progress Notes (Signed)
